# Patient Record
Sex: Female | Born: 1935 | Race: Black or African American | Hispanic: No | Smoking: Never smoker
Health system: Southern US, Community
[De-identification: ages and names within clinical notes are randomized; demographics above are authoritative.]

## PROBLEM LIST (undated history)

## (undated) DIAGNOSIS — H409 Unspecified glaucoma: Secondary | ICD-10-CM

## (undated) DIAGNOSIS — G309 Alzheimer's disease, unspecified: Secondary | ICD-10-CM

## (undated) DIAGNOSIS — N289 Disorder of kidney and ureter, unspecified: Secondary | ICD-10-CM

## (undated) DIAGNOSIS — I10 Essential (primary) hypertension: Secondary | ICD-10-CM

## (undated) DIAGNOSIS — H547 Unspecified visual loss: Secondary | ICD-10-CM

## (undated) DIAGNOSIS — F0280 Dementia in other diseases classified elsewhere without behavioral disturbance: Secondary | ICD-10-CM

## (undated) HISTORY — PX: ABDOMINAL HYSTERECTOMY: SHX81

## (undated) HISTORY — PX: EYE SURGERY: SHX253

---

## 2017-05-10 ENCOUNTER — Emergency Department
Admission: EM | Admit: 2017-05-10 | Discharge: 2017-05-10 | Disposition: A | Discharge status: Other Acute Inpatient Hospital | Payer: Medicare Other | Attending: Emergency Medicine | Admitting: Emergency Medicine

## 2017-05-10 ENCOUNTER — Encounter: Payer: Self-pay | Admitting: *Deleted

## 2017-05-10 ENCOUNTER — Emergency Department: Payer: Medicare Other

## 2017-05-10 ENCOUNTER — Ambulatory Visit
Admission: EM | Admit: 2017-05-10 | Discharge: 2017-05-10 | Disposition: A | Discharge status: Other Acute Inpatient Hospital | Payer: Medicaid Other | Attending: Family Medicine | Admitting: Family Medicine

## 2017-05-10 ENCOUNTER — Other Ambulatory Visit: Payer: Self-pay

## 2017-05-10 DIAGNOSIS — S0592XA Unspecified injury of left eye and orbit, initial encounter: Secondary | ICD-10-CM | POA: Diagnosis not present

## 2017-05-10 DIAGNOSIS — S0522XA Ocular laceration and rupture with prolapse or loss of intraocular tissue, left eye, initial encounter: Secondary | ICD-10-CM | POA: Insufficient documentation

## 2017-05-10 DIAGNOSIS — Z7982 Long term (current) use of aspirin: Secondary | ICD-10-CM | POA: Diagnosis not present

## 2017-05-10 DIAGNOSIS — S0590XA Unspecified injury of unspecified eye and orbit, initial encounter: Principal | ICD-10-CM

## 2017-05-10 DIAGNOSIS — W228XXA Striking against or struck by other objects, initial encounter: Secondary | ICD-10-CM

## 2017-05-10 DIAGNOSIS — W19XXXA Unspecified fall, initial encounter: Secondary | ICD-10-CM | POA: Diagnosis not present

## 2017-05-10 DIAGNOSIS — W01190A Fall on same level from slipping, tripping and stumbling with subsequent striking against furniture, initial encounter: Secondary | ICD-10-CM | POA: Diagnosis not present

## 2017-05-10 DIAGNOSIS — I10 Essential (primary) hypertension: Secondary | ICD-10-CM | POA: Diagnosis not present

## 2017-05-10 DIAGNOSIS — S0532XA Ocular laceration without prolapse or loss of intraocular tissue, left eye, initial encounter: Secondary | ICD-10-CM

## 2017-05-10 DIAGNOSIS — S0990XA Unspecified injury of head, initial encounter: Secondary | ICD-10-CM | POA: Diagnosis not present

## 2017-05-10 DIAGNOSIS — Y929 Unspecified place or not applicable: Secondary | ICD-10-CM | POA: Diagnosis not present

## 2017-05-10 DIAGNOSIS — Y998 Other external cause status: Secondary | ICD-10-CM | POA: Insufficient documentation

## 2017-05-10 DIAGNOSIS — Y9389 Activity, other specified: Secondary | ICD-10-CM | POA: Diagnosis not present

## 2017-05-10 DIAGNOSIS — Z79899 Other long term (current) drug therapy: Secondary | ICD-10-CM | POA: Insufficient documentation

## 2017-05-10 HISTORY — DX: Essential (primary) hypertension: I10

## 2017-05-10 HISTORY — DX: Disorder of kidney and ureter, unspecified: N28.9

## 2017-05-10 HISTORY — DX: Unspecified visual loss: H54.7

## 2017-05-10 LAB — CBC WITH DIFFERENTIAL/PLATELET
BASOS ABS: 0.1 10*3/uL (ref 0–0.1)
BASOS PCT: 1 %
EOS ABS: 0.1 10*3/uL (ref 0–0.7)
EOS PCT: 1 %
HCT: 40.9 % (ref 35.0–47.0)
Hemoglobin: 13.5 g/dL (ref 12.0–16.0)
Lymphocytes Relative: 15 %
Lymphs Abs: 1.7 10*3/uL (ref 1.0–3.6)
MCH: 31 pg (ref 26.0–34.0)
MCHC: 33.1 g/dL (ref 32.0–36.0)
MCV: 93.5 fL (ref 80.0–100.0)
Monocytes Absolute: 0.4 10*3/uL (ref 0.2–0.9)
Monocytes Relative: 4 %
NEUTROS PCT: 79 %
Neutro Abs: 9.1 10*3/uL — ABNORMAL HIGH (ref 1.4–6.5)
PLATELETS: 198 10*3/uL (ref 150–440)
RBC: 4.37 MIL/uL (ref 3.80–5.20)
RDW: 13.1 % (ref 11.5–14.5)
WBC: 11.3 10*3/uL — AB (ref 3.6–11.0)

## 2017-05-10 LAB — BASIC METABOLIC PANEL
ANION GAP: 8 (ref 5–15)
BUN: 13 mg/dL (ref 6–20)
CO2: 28 mmol/L (ref 22–32)
Calcium: 9.6 mg/dL (ref 8.9–10.3)
Chloride: 100 mmol/L — ABNORMAL LOW (ref 101–111)
Creatinine, Ser: 0.77 mg/dL (ref 0.44–1.00)
Glucose, Bld: 130 mg/dL — ABNORMAL HIGH (ref 65–99)
POTASSIUM: 4.1 mmol/L (ref 3.5–5.1)
SODIUM: 136 mmol/L (ref 135–145)

## 2017-05-10 MED ORDER — MORPHINE SULFATE (PF) 2 MG/ML IV SOLN
2.0000 mg | Freq: Once | INTRAVENOUS | Status: AC
  Administered 2017-05-10: 2 mg via INTRAMUSCULAR

## 2017-05-10 MED ORDER — LIDOCAINE HCL (PF) 1 % IJ SOLN
.50 | INTRAMUSCULAR | Status: DC
Start: ? — End: 2017-05-10

## 2017-05-10 MED ORDER — MORPHINE SULFATE (PF) 2 MG/ML IV SOLN
INTRAVENOUS | Status: AC
  Filled 2017-05-10: qty 1

## 2017-05-10 NOTE — ED Provider Notes (Signed)
[facility] Emergency Department Provider Note   ____________________________________________   I have reviewed the triage vital signs and the nursing notes.   HISTORY  Chief Complaint Facial Injury   History limited by: Not Limited   HPI Meagan Shah is a 84 y.o. female who presents to the emergency department today because of concern for left eye trauma. Patient went to sit back in a chair and missed it. States she hit her head against a piece of furniture. Went to urgent care which sent her to the emergency department because of concern for globe rupture. Patient however is legally blind and states that she has not been able to see for a long time.    Per medical record review patient has a history of blindness, HTN.   Past Medical History:  Diagnosis Date  . Blind   . Hypertension   . Renal disorder     There are no active problems to display for this patient.   Past Surgical History:  Procedure Laterality Date  . ABDOMINAL HYSTERECTOMY    . EYE SURGERY      Prior to Admission medications   Medication Sig Start Date End Date Taking? Authorizing Provider  amLODipine (NORVASC) 2.5 MG tablet Take 2.5 mg by mouth daily.   Yes [provider]  aspirin EC 81 MG tablet Take 81 mg by mouth daily.   Yes [provider]  Calcium Carb-Cholecalciferol (CALCIUM 1000 + D PO) Take 1 tablet by mouth daily.    Yes [provider]  losartan (COZAAR) 100 MG tablet Take 100 mg by mouth daily.    Yes [provider]  timolol (BETIMOL) 0.5 % ophthalmic solution Place 1 drop into both eyes daily.    Yes [provider]    Allergies Sulfa antibiotics  No family history on file.  Social History Social History   Tobacco Use  . Smoking status: Never Smoker  . Smokeless tobacco: Former NeurosurgeonUser    Types: Snuff  Substance Use Topics  . Alcohol use: No    Frequency: Never  . Drug use: No    Review of  Systems Constitutional: No fever/chills Eyes: Positive for left eye pain. ENT: No sore throat. Cardiovascular: Denies chest pain. Respiratory: Denies shortness of breath. Gastrointestinal: No abdominal pain.  No nausea, no vomiting.  No diarrhea.   Genitourinary: Negative for dysuria. Musculoskeletal: Negative for back pain. Skin: Negative for rash. Neurological: Positive for headache.  ____________________________________________   PHYSICAL EXAM:  VITAL SIGNS: ED Triage Vitals [05/10/17 1112]  Enc Vitals Group     BP (!) 189/87     Pulse Rate 80     Resp 18     Temp 98.9 F (37.2 C)     Temp Source Oral     SpO2 100 %     Weight 130 lb (59 kg)     Height 5\' 4"  (1.626 m)     Head Circumference      Peak Flow      Pain Score 10   Constitutional: Alert and oriented.  Eyes: Left eye with obvious trauma to the cornea.  Question ocular contents being extruded. ENT   Head: Normocephalic and atraumatic.   Nose: No congestion/rhinnorhea.   Mouth/Throat: Mucous membranes are moist.   Neck: No stridor. No midline tenderness.  Hematological/Lymphatic/Immunilogical: No cervical lymphadenopathy. Cardiovascular: Normal rate, regular rhythm.  No murmurs, rubs, or gallops.  Respiratory: Normal respiratory effort without tachypnea nor retractions. Breath sounds are clear and  equal bilaterally. No wheezes/rales/rhonchi. Gastrointestinal: Soft and non tender. No rebound. No guarding.  Genitourinary: Deferred Musculoskeletal: Normal range of motion in all extremities. Bilateral lower extremity swelling. Neurologic:  Normal speech and language. No gross focal neurologic deficits are appreciated.  Skin:  Skin is warm, dry and intact. No rash noted. Psychiatric: Mood and affect are normal. Speech and behavior are normal. Patient exhibits appropriate insight and judgment.  ____________________________________________    LABS (pertinent  positives/negatives)    ____________________________________________   EKG  None  ____________________________________________    RADIOLOGY  CT head/orbits No intracranial process. Left globe trauma with intra-vitreous hemorrhage.  ____________________________________________   PROCEDURES  Procedures  ____________________________________________   INITIAL IMPRESSION / ASSESSMENT AND PLAN / ED COURSE  Pertinent labs & imaging results that were available during my care of the patient were reviewed by me and considered in my medical decision making (see chart for details).  Presented to the emergency department today because of concerns for left eye injury.  On exam I do have concerns for significant injury and potential globe rupture. Did limit exam given concern for globe rupture.   Did send patient to CT which did show intraventricular hemorrhage.  Dr. Druscilla Brownie with ophthalmology came and evaluated the patient.  Feels patient might require enucleation but certainly requires more advanced care than can be accomplished here.  Will transfer patient to [facility].  ____________________________________________   FINAL CLINICAL IMPRESSION(S) / ED DIAGNOSES  Final diagnoses:  Ruptured globe of left eye, initial encounter     Note: This dictation was prepared with Dragon dictation. Any transcriptional errors that result from this process are unintentional     Phineas Semen, MD 05/10/17 1406

## 2017-05-10 NOTE — ED Triage Notes (Signed)
PAtient fell and injured her left eye this AM. Patient is blind.

## 2017-05-10 NOTE — ED Provider Notes (Signed)
[facility]    CSN: [id number] Arrival date & time: 05/10/17  1011     History   Chief Complaint Chief Complaint  Patient presents with  . Eye Injury    HPI Meagan Shah is a 84 y.o. female.   84 yo female accompanied by daughter with a c/o left eye injury after falling this morning and hitting her face and eyeball against a dresser.    The history is provided by a relative.    Past Medical History:  Diagnosis Date  . Blind   . Hypertension   . Renal disorder     There are no active problems to display for this patient.   Past Surgical History:  Procedure Laterality Date  . ABDOMINAL HYSTERECTOMY    . EYE SURGERY      OB History    No data available       Home Medications    Prior to Admission medications   Medication Sig Start Date End Date Taking? Authorizing Provider  amLODipine (NORVASC) 2.5 MG tablet Take 2.5 mg by mouth daily.   Yes [provider]  aspirin EC 81 MG tablet Take 81 mg by mouth daily.   Yes [provider]  Calcium Carb-Cholecalciferol (CALCIUM 1000 + D PO) Take 1 tablet by mouth daily.    Yes [provider]  losartan (COZAAR) 100 MG tablet Take 100 mg by mouth daily.    Yes [provider]  timolol (BETIMOL) 0.5 % ophthalmic solution Place 1 drop into both eyes daily.    Yes [provider]    Family History History reviewed. No pertinent family history.  Social History Social History   Tobacco Use  . Smoking status: Never Smoker  . Smokeless tobacco: Former Neurosurgeon    Types: Snuff  Substance Use Topics  . Alcohol use: No    Frequency: Never  . Drug use: No     Allergies   Sulfa antibiotics   Review of Systems Review of Systems   Physical Exam Triage Vital Signs ED Triage Vitals  Enc Vitals Group     BP 05/10/17 1025 (!) 172/68     Pulse Rate 05/10/17 1025 78     Resp 05/10/17 1025 16     Temp 05/10/17 1025 99 F (37.2 C)     Temp Source 05/10/17  1025 Oral     SpO2 05/10/17 1025 100 %     Weight 05/10/17 1029 130 lb (59 kg)     Height 05/10/17 1029 5\' 4"  (1.626 m)     Head Circumference --      Peak Flow --      Pain Score 05/10/17 1028 5     Pain Loc --      Pain Edu? --      Excl. in GC? --    No data found.  Updated Vital Signs BP (!) 172/68 (BP Location: Left Arm)   Pulse 78   Temp 99 F (37.2 C) (Oral)   Resp 16   Ht 5\' 4"  (1.626 m)   Wt 130 lb (59 kg)   SpO2 100%   BMI 22.31 kg/m   Visual Acuity Right Eye Distance:   Left Eye Distance:   Bilateral Distance:    Right Eye Near:   Left Eye Near:    Bilateral Near:     Physical Exam  Constitutional: She appears well-developed and well-nourished. No distress.  HENT:  Tenderness to palpation over the periorbital bones on  the left  Eyes: Left eye exhibits abnormal extraocular motion. Left pupil is not round. Pupils are unequal.  Blood from eye globe  Skin: She is not diaphoretic.  Nursing note and vitals reviewed.    UC Treatments / Results  Labs (all labs ordered are listed, but only abnormal results are displayed) Labs Reviewed - No data to display  EKG  EKG Interpretation None       Radiology No results found.  Procedures Procedures (including critical care time)  Medications Ordered in UC Medications - No data to display   Initial Impression / Assessment and Plan / UC Course  I have reviewed the triage vital signs and the nursing notes.  Pertinent labs & imaging results that were available during my care of the patient were reviewed by me and considered in my medical decision making (see chart for details).       Final Clinical Impressions(s) / UC Diagnoses   Final diagnoses:  Traumatic injury of globe of left eye    ED Discharge Orders    None     1.  diagnosis reviewed with patient and daughter; recommend patient go to the Emergency Department for further evaluation and management; patient will be transported by  daughter in private vehicle; report [facility]   Controlled Substance Prescriptions [place] Controlled Substance Registry consulted? Not Applicable   Payton Mccallumonty, Francesco Provencal, MD 05/10/17 [phone]

## 2017-05-10 NOTE — ED Notes (Signed)
Report given to EMS

## 2017-05-10 NOTE — ED Triage Notes (Signed)
approx 0945 this am patient fell and hit head on corner of furniture. Denies LOC. Patient is legally blind. Advance report was called in [facility] Izell [facility]ont[facility] with concerns patient had globe trauma.

## 2017-05-10 NOTE — ED Notes (Signed)
Dr. Burman NievesPortfilo at bedside.

## 2017-05-10 NOTE — ED Notes (Signed)
emtala reviewed by this RN 

## 2017-05-10 NOTE — ED Notes (Signed)
Patient left for CT scan,

## 2017-05-10 NOTE — Consult Note (Signed)
Subjective: Pt fell back in chair rotating to the left and struck OS on the corner of a wooden chest. Sudden pain and bleeding.  Pt has been blind for " many years" due to glaucoma, per she and her family. She is a known pt. At the [facility].  Objective: Vital signs in last 24 hours: Temp:  [98.9 F (37.2 C)-99 F (37.2 C)] 98.9 F (37.2 C) (03/02 1112) Pulse Rate:  [78-80] 80 (03/02 1145) Resp:  [16-18] 18 (03/02 1145) BP: (172-211)/(68-100) 211/100 (03/02 1145) SpO2:  [100 %] 100 % (03/02 1145) Weight:  [59 kg (130 lb)] 59 kg (130 lb) (03/02 1112)    To  examination of the left eye,blood soaked gauze was removed. There is minimal edema of lids. On gentle digital opening of the eye and examination with a hand held slit lamp, the cornea appears grossly ruptured with expulsion of iris, vitreous and uvea.   No results for input(s): WBC, HGB, HCT, NA, K, CL, CO2, BUN, CREATININE, GLU in the last 72 hours.  Invalid input(s): PLATELETS  Studies/Results:   Assessment/Plan: I reviewed the CT scan. The eye is full of blood, the retina/choroid appear displaced, the front of the eye appears disrupted. The sclera is largely intact.  This repair requires extensive surgery, possible vitrectomy. I did discuss possible primary enucleation , or primary repair followed by possible secondary enucleation.with the patient and family. This will be taken up with the consulting surgeon.  I have recommended medical transfer to [facility]. [facility] are [facility] if desired.  LOS: 0 days   Galen ManilaWilliam Abdirahim Flavell 3/2/20191:41 PM

## 2017-05-11 MED ORDER — ONDANSETRON HCL 4 MG/2ML IJ SOLN
4.00 | INTRAMUSCULAR | Status: DC
Start: ? — End: 2017-05-11

## 2017-05-11 MED ORDER — FENTANYL CITRATE (PF) 2500 MCG/50ML IJ SOLN
12.50 | INTRAMUSCULAR | Status: DC
Start: ? — End: 2017-05-11

## 2017-05-11 MED ORDER — LABETALOL HCL 5 MG/ML IV SOLN
10.00 | INTRAVENOUS | Status: DC
Start: ? — End: 2017-05-11

## 2017-05-11 MED ORDER — LACTATED RINGERS IV SOLN
INTRAVENOUS | Status: DC
Start: ? — End: 2017-05-11

## 2017-05-17 MED ORDER — BRIMONIDINE TARTRATE 0.15 % OP SOLN
1.00 | OPHTHALMIC | Status: DC
Start: 2017-05-15 — End: 2017-05-17

## 2017-05-17 MED ORDER — HEPARIN SODIUM (PORCINE) 5000 UNIT/ML IJ SOLN
5000.00 | INTRAMUSCULAR | Status: DC
Start: 2017-05-15 — End: 2017-05-17

## 2017-05-17 MED ORDER — MELATONIN 3 MG PO TABS
3.00 | ORAL_TABLET | ORAL | Status: DC
Start: 2017-05-15 — End: 2017-05-17

## 2017-05-17 MED ORDER — HYDRALAZINE HCL 20 MG/ML IJ SOLN
10.00 | INTRAMUSCULAR | Status: DC
Start: ? — End: 2017-05-17

## 2017-05-17 MED ORDER — GENERIC EXTERNAL MEDICATION
Status: DC
Start: ? — End: 2017-05-17

## 2017-05-17 MED ORDER — ASPIRIN EC 81 MG PO TBEC
81.00 | DELAYED_RELEASE_TABLET | ORAL | Status: DC
Start: 2017-05-16 — End: 2017-05-17

## 2017-05-17 MED ORDER — POLYETHYLENE GLYCOL 3350 17 G PO PACK
17.00 | PACK | ORAL | Status: DC
Start: 2017-05-16 — End: 2017-05-17

## 2017-05-17 MED ORDER — ACETAMINOPHEN 325 MG PO TABS
650.00 | ORAL_TABLET | ORAL | Status: DC
Start: ? — End: 2017-05-17

## 2017-05-17 MED ORDER — MOXIFLOXACIN HCL 0.5 % OP SOLN
1.00 | OPHTHALMIC | Status: DC
Start: 2017-05-15 — End: 2017-05-17

## 2017-05-17 MED ORDER — BISACODYL 10 MG RE SUPP
10.00 | RECTAL | Status: DC
Start: ? — End: 2017-05-17

## 2017-05-17 MED ORDER — ATROPINE SULFATE 1 % OP SOLN
1.00 | OPHTHALMIC | Status: DC
Start: 2017-05-15 — End: 2017-05-17

## 2017-05-17 MED ORDER — LOSARTAN POTASSIUM 50 MG PO TABS
100.00 | ORAL_TABLET | ORAL | Status: DC
Start: 2017-05-16 — End: 2017-05-17

## 2017-05-17 MED ORDER — AMLODIPINE BESYLATE 5 MG PO TABS
5.00 | ORAL_TABLET | ORAL | Status: DC
Start: 2017-05-16 — End: 2017-05-17

## 2017-05-17 MED ORDER — ERYTHROMYCIN 5 MG/GM OP OINT
1.00 | TOPICAL_OINTMENT | OPHTHALMIC | Status: DC
Start: 2017-05-15 — End: 2017-05-17

## 2017-05-17 MED ORDER — PREDNISOLONE ACETATE 1 % OP SUSP
1.00 | OPHTHALMIC | Status: DC
Start: 2017-05-15 — End: 2017-05-17

## 2017-10-08 ENCOUNTER — Inpatient Hospital Stay
Admission: EM | Admit: 2017-10-08 | Discharge: 2017-10-09 | DRG: 208 | Disposition: A | Discharge status: Hospice | Payer: Medicare Other | Source: Skilled Nursing Facility | Attending: Internal Medicine | Admitting: Internal Medicine

## 2017-10-08 ENCOUNTER — Emergency Department: Payer: Medicare Other

## 2017-10-08 ENCOUNTER — Other Ambulatory Visit: Payer: Self-pay

## 2017-10-08 DIAGNOSIS — R739 Hyperglycemia, unspecified: Secondary | ICD-10-CM | POA: Diagnosis present

## 2017-10-08 DIAGNOSIS — E870 Hyperosmolality and hypernatremia: Secondary | ICD-10-CM | POA: Diagnosis present

## 2017-10-08 DIAGNOSIS — Z7982 Long term (current) use of aspirin: Secondary | ICD-10-CM

## 2017-10-08 DIAGNOSIS — Z515 Encounter for palliative care: Secondary | ICD-10-CM | POA: Insufficient documentation

## 2017-10-08 DIAGNOSIS — Z880 Allergy status to penicillin: Secondary | ICD-10-CM | POA: Diagnosis not present

## 2017-10-08 DIAGNOSIS — J9600 Acute respiratory failure, unspecified whether with hypoxia or hypercapnia: Secondary | ICD-10-CM | POA: Diagnosis present

## 2017-10-08 DIAGNOSIS — H547 Unspecified visual loss: Secondary | ICD-10-CM | POA: Diagnosis present

## 2017-10-08 DIAGNOSIS — Z9289 Personal history of other medical treatment: Secondary | ICD-10-CM

## 2017-10-08 DIAGNOSIS — N182 Chronic kidney disease, stage 2 (mild): Secondary | ICD-10-CM | POA: Diagnosis present

## 2017-10-08 DIAGNOSIS — Z66 Do not resuscitate: Secondary | ICD-10-CM | POA: Diagnosis present

## 2017-10-08 DIAGNOSIS — Z79899 Other long term (current) drug therapy: Secondary | ICD-10-CM

## 2017-10-08 DIAGNOSIS — Z882 Allergy status to sulfonamides status: Secondary | ICD-10-CM

## 2017-10-08 DIAGNOSIS — Z83511 Family history of glaucoma: Secondary | ICD-10-CM

## 2017-10-08 DIAGNOSIS — F0280 Dementia in other diseases classified elsewhere without behavioral disturbance: Secondary | ICD-10-CM | POA: Diagnosis present

## 2017-10-08 DIAGNOSIS — J9601 Acute respiratory failure with hypoxia: Secondary | ICD-10-CM | POA: Diagnosis present

## 2017-10-08 DIAGNOSIS — R011 Cardiac murmur, unspecified: Secondary | ICD-10-CM | POA: Diagnosis present

## 2017-10-08 DIAGNOSIS — Z8249 Family history of ischemic heart disease and other diseases of the circulatory system: Secondary | ICD-10-CM

## 2017-10-08 DIAGNOSIS — Z90710 Acquired absence of both cervix and uterus: Secondary | ICD-10-CM | POA: Diagnosis not present

## 2017-10-08 DIAGNOSIS — N179 Acute kidney failure, unspecified: Secondary | ICD-10-CM | POA: Diagnosis present

## 2017-10-08 DIAGNOSIS — E872 Acidosis: Secondary | ICD-10-CM | POA: Diagnosis present

## 2017-10-08 DIAGNOSIS — I469 Cardiac arrest, cause unspecified: Secondary | ICD-10-CM | POA: Diagnosis present

## 2017-10-08 DIAGNOSIS — H409 Unspecified glaucoma: Secondary | ICD-10-CM | POA: Diagnosis present

## 2017-10-08 DIAGNOSIS — I129 Hypertensive chronic kidney disease with stage 1 through stage 4 chronic kidney disease, or unspecified chronic kidney disease: Secondary | ICD-10-CM | POA: Diagnosis present

## 2017-10-08 DIAGNOSIS — G309 Alzheimer's disease, unspecified: Secondary | ICD-10-CM | POA: Diagnosis present

## 2017-10-08 HISTORY — DX: Dementia in other diseases classified elsewhere, unspecified severity, without behavioral disturbance, psychotic disturbance, mood disturbance, and anxiety: F02.80

## 2017-10-08 HISTORY — DX: Alzheimer's disease, unspecified: G30.9

## 2017-10-08 HISTORY — DX: Unspecified glaucoma: H40.9

## 2017-10-08 LAB — CBC
HCT: 32.5 % — ABNORMAL LOW (ref 35.0–47.0)
HEMOGLOBIN: 10.1 g/dL — AB (ref 12.0–16.0)
MCH: 30.1 pg (ref 26.0–34.0)
MCHC: 31 g/dL — ABNORMAL LOW (ref 32.0–36.0)
MCV: 97.3 fL (ref 80.0–100.0)
Platelets: 72 10*3/uL — ABNORMAL LOW (ref 150–440)
RBC: 3.34 MIL/uL — AB (ref 3.80–5.20)
RDW: 13.3 % (ref 11.5–14.5)
WBC: 7.5 10*3/uL (ref 3.6–11.0)

## 2017-10-08 LAB — BLOOD GAS, ARTERIAL
Acid-base deficit: 14.8 mmol/L — ABNORMAL HIGH (ref 0.0–2.0)
Bicarbonate: 14 mmol/L — ABNORMAL LOW (ref 20.0–28.0)
FIO2: 1
O2 SAT: 99.9 %
PCO2 ART: 44 mmHg (ref 32.0–48.0)
PEEP: 5 cmH2O
Patient temperature: 37
RATE: 16 resp/min
VT: 400 mL
pH, Arterial: 7.11 — CL (ref 7.350–7.450)
pO2, Arterial: 414 mmHg — ABNORMAL HIGH (ref 83.0–108.0)

## 2017-10-08 LAB — COMPREHENSIVE METABOLIC PANEL
ALBUMIN: 2.4 g/dL — AB (ref 3.5–5.0)
ALK PHOS: 46 U/L (ref 38–126)
ALT: 435 U/L — ABNORMAL HIGH (ref 0–44)
AST: 521 U/L — ABNORMAL HIGH (ref 15–41)
Anion gap: 23 — ABNORMAL HIGH (ref 5–15)
BUN: 20 mg/dL (ref 8–23)
CALCIUM: 8.3 mg/dL — AB (ref 8.9–10.3)
CHLORIDE: 108 mmol/L (ref 98–111)
CO2: 15 mmol/L — AB (ref 22–32)
Creatinine, Ser: 1.41 mg/dL — ABNORMAL HIGH (ref 0.44–1.00)
GFR calc non Af Amer: 30 mL/min — ABNORMAL LOW (ref >60)
GFR, EST AFRICAN AMERICAN: 35 mL/min — AB (ref >60)
Glucose, Bld: 502 mg/dL (ref 70–99)
Potassium: 4.3 mmol/L (ref 3.5–5.1)
SODIUM: 146 mmol/L — AB (ref 135–145)
Total Bilirubin: 0.6 mg/dL (ref 0.3–1.2)
Total Protein: 5 g/dL — ABNORMAL LOW (ref 6.5–8.1)

## 2017-10-08 LAB — TROPONIN I: Troponin I: 0.05 ng/mL (ref <0.03)

## 2017-10-08 LAB — C DIFFICILE QUICK SCREEN W PCR REFLEX
C DIFFICILE (CDIFF) INTERP: NOT DETECTED
C DIFFICILE (CDIFF) TOXIN: NEGATIVE
C DIFFICLE (CDIFF) ANTIGEN: NEGATIVE

## 2017-10-08 LAB — LACTIC ACID, PLASMA: LACTIC ACID, VENOUS: 14.3 mmol/L — AB (ref 0.5–1.9)

## 2017-10-08 MED ORDER — SODIUM CHLORIDE 0.9 % IV SOLN: INTRAVENOUS | Status: DC

## 2017-10-08 MED ORDER — ASPIRIN EC 81 MG PO TBEC: 81.0000 mg | DELAYED_RELEASE_TABLET | Freq: Every day | ORAL | Status: DC

## 2017-10-08 MED ORDER — SODIUM CHLORIDE 0.9 % IV SOLN
Freq: Once | INTRAVENOUS | Status: AC
  Administered 2017-10-08: 08:00:00 via INTRAVENOUS

## 2017-10-08 MED ORDER — LORAZEPAM 2 MG/ML IJ SOLN
1.0000 mg | INTRAMUSCULAR | Status: DC | PRN
  Administered 2017-10-09 (×2): 1 mg via INTRAVENOUS
  Filled 2017-10-08 (×2): qty 1

## 2017-10-08 MED ORDER — ASPIRIN 300 MG RE SUPP: 300.0000 mg | RECTAL | Status: DC

## 2017-10-08 MED ORDER — SODIUM CHLORIDE 0.9 % IV SOLN: 250.0000 mL | INTRAVENOUS | Status: DC | PRN

## 2017-10-08 MED ORDER — ASPIRIN 81 MG PO CHEW: 324.0000 mg | CHEWABLE_TABLET | ORAL | Status: DC

## 2017-10-08 MED ORDER — EPINEPHRINE PF 1 MG/10ML IJ SOSY
PREFILLED_SYRINGE | INTRAMUSCULAR | Status: AC | PRN
  Administered 2017-10-08 (×3): 1 mg via INTRAVENOUS

## 2017-10-08 MED ORDER — ENOXAPARIN SODIUM 40 MG/0.4ML ~~LOC~~ SOLN: 40.0000 mg | SUBCUTANEOUS | Status: DC

## 2017-10-08 MED ORDER — BIOTENE DRY MOUTH MT LIQD
15.0000 mL | OROMUCOSAL | Status: DC | PRN
  Filled 2017-10-08: qty 15

## 2017-10-08 MED ORDER — MORPHINE SULFATE (PF) 2 MG/ML IV SOLN
1.0000 mg | INTRAVENOUS | Status: DC | PRN
  Administered 2017-10-08 – 2017-10-09 (×2): 1 mg via INTRAVENOUS
  Filled 2017-10-08 (×2): qty 1

## 2017-10-08 MED ORDER — NOREPINEPHRINE 4 MG/250ML-% IV SOLN
0.5000 ug/kg/min | Freq: Once | INTRAVENOUS | Status: AC
  Administered 2017-10-08: 0.5 ug/kg/min via INTRAVENOUS

## 2017-10-08 MED ORDER — GLYCOPYRROLATE 0.2 MG/ML IJ SOLN
0.2000 mg | INTRAMUSCULAR | Status: DC | PRN
  Administered 2017-10-08 – 2017-10-09 (×2): 0.2 mg via INTRAVENOUS
  Filled 2017-10-08 (×5): qty 1

## 2017-10-08 MED ORDER — EPINEPHRINE PF 1 MG/10ML IJ SOSY
PREFILLED_SYRINGE | INTRAMUSCULAR | Status: AC | PRN
  Administered 2017-10-08: 1 mg via INTRAVENOUS

## 2017-10-08 MED ORDER — FAMOTIDINE IN NACL 20-0.9 MG/50ML-% IV SOLN: 20.0000 mg | Freq: Two times a day (BID) | INTRAVENOUS | Status: DC

## 2017-10-08 MED ORDER — ONDANSETRON HCL 4 MG/2ML IJ SOLN: 4.0000 mg | Freq: Four times a day (QID) | INTRAMUSCULAR | Status: DC | PRN

## 2017-10-08 MED ORDER — ORAL CARE MOUTH RINSE
15.0000 mL | Freq: Two times a day (BID) | OROMUCOSAL | Status: DC
  Administered 2017-10-08 – 2017-10-09 (×3): 15 mL via OROMUCOSAL

## 2017-10-08 MED FILL — Medication: Qty: 1 | Status: AC

## 2017-10-08 NOTE — ED Provider Notes (Addendum)
[facility] Emergency Department Provider Note   ____________________________________________   First MD Initiated Contact with Patient 10/08/17 [phone]     (approximate)  I have reviewed the triage vital signs and the nursing notes.   HISTORY  Chief Complaint Respiratory Arrest   HPI Meagan Shah is a 84 y.o. female who comes from the nursing home.  EMS reports they were called because she was unresponsive.  She was breathing 12 times a minute when they got there but rapidly developed agonal respirations she was intubated with an ET tube and on the way here lost her pulse.  On arrival in the emergency room she was given epinephrine and bicarb and then 2 more doses of epinephrine while CPR was performed patient regained her pulse she was placed on levo fed at 0.5 mics per kilo per minute after few minutes pulse weekend Levophed was increased to 1 mic per kilo per minute but patient had already lost her pulse CPR was resumed she got another epinephrine pulse returned we are now paging the hospitalist  Past Medical History:  Diagnosis Date  . Blind   . Hypertension   . Renal disorder     There are no active problems to display for this patient.   Past Surgical History:  Procedure Laterality Date  . ABDOMINAL HYSTERECTOMY    . EYE SURGERY      Prior to Admission medications   Medication Sig Start Date End Date Taking? Authorizing Provider  aspirin EC 81 MG tablet Take 81 mg by mouth daily.   Yes [provider]  losartan (COZAAR) 100 MG tablet Take 100 mg by mouth daily.    Yes [provider]  Multiple Vitamins-Minerals (THERAVIM-M) TABS Take 1 tablet by mouth daily.   Yes [provider]  prednisoLONE acetate (PRED FORTE) 1 % ophthalmic suspension Place 1 drop into both eyes 3 (three) times daily. Wait 3 to 5 minutes between eye drops.   Yes [provider]  risperiDONE (RISPERDAL) 0.25 MG tablet Take 0.25 mg by  mouth daily.   Yes [provider]  timolol (BETIMOL) 0.5 % ophthalmic solution Place 1 drop into both eyes daily.    Yes [provider]    Allergies Sulfa antibiotics  No family history on file.  Social History Social History   Tobacco Use  . Smoking status: Never Smoker  . Smokeless tobacco: Former Neurosurgeon    Types: Snuff  Substance Use Topics  . Alcohol use: No    Frequency: Never  . Drug use: No    Review of Systems  Unable to obtain due to code ____________________________________________   PHYSICAL EXAM:  VITAL SIGNS: ED Triage Vitals  Enc Vitals Group     BP 10/08/17 0810 (!) 62/47     Pulse --      Resp 10/08/17 0810 (!) 21     Temp --      Temp src --      SpO2 --      Weight 10/08/17 0806 121 lb 4.1 oz (55 kg)     Height --      Head Circumference --      Peak Flow --      Pain Score --      Pain Loc --      Pain Edu? --      Excl. in GC? --     Constitutional: Unresponsive intubated Eyes: Conjunctivae are normal. Head: Atraumatic. Nose: No congestion/rhinnorhea. Mouth/Throat: Mucous  membranes are moist.  Oropharynx non-erythematous. Neck: No stridor.  Intubated  cardiovascular: 1 patient has a pulse normal rate, regular rhythm. Grossly normal heart sounds.  Good peripheral circulation. Respiratory: Intubated. Lungs CTAB. Gastrointestinal: Soft and nontender. No distention. No abdominal bruits. No CVA tenderness. Musculoskeletal: No lower extremity tenderness nor edema.  Neurologic: Unresponsive Skin:  Skin is warm, dry and intact. No rash noted.   ____________________________________________   LABS (all labs ordered are listed, but only abnormal results are displayed)  Labs Reviewed  BLOOD GAS, ARTERIAL - Abnormal; Notable for the following components:      Result Value   pH, Arterial 7.11 (*)    pO2, Arterial 414 (*)    Bicarbonate 14.0 (*)    Acid-base deficit 14.8 (*)    Allens test (pass/fail) NOT APPLICABLE (*)     All other components within normal limits  CBC - Abnormal; Notable for the following components:   RBC 3.34 (*)    Hemoglobin 10.1 (*)    HCT 32.5 (*)    MCHC 31.0 (*)    Platelets 72 (*)    All other components within normal limits  LACTIC ACID, PLASMA  COMPREHENSIVE METABOLIC PANEL  TROPONIN I  CBG MONITORING, ED   ____________________________________________  EKG  EKG read and interpreted by me does show sinus tachycardia rate of 101 normal axis flipped T's inferiorly and in the chest leads laterally no ST segment elevation ____________________________________________  RADIOLOGY  ED MD interpretation: Chest x-ray read by me shows ET tube in good position no infiltrates no pneumothorax  Official radiology report(s): Dg Chest Portable 1 View  Result Date: 10/08/2017 CLINICAL DATA:  84 year old female status post CPR and intubation. EXAM: PORTABLE CHEST 1 VIEW COMPARISON:  None. FINDINGS: Portable AP supine view at 0809 hours. Pacer/resuscitation pads project over the left chest. Endotracheal tube tip in good position just below the level the clavicles. Calcified aortic atherosclerosis. Other mediastinal contours are within normal limits. No pneumothorax or pleural effusion identified. Allowing for portable technique the lungs are clear. No displaced rib fracture identified. There is mild to moderate gaseous distension of the stomach in the left upper quadrant. IMPRESSION: 1. Endotracheal tube tip in good position. No acute cardiopulmonary abnormality identified. 2. Mild to moderate gaseous distension of the stomach. Consider enteric tube placement. 3.  Aortic Atherosclerosis (ICD10-I70.0). Electronically Signed   By: Odessa FlemingH  Hall M.D.   On: 10/08/2017 08:29   Dg Abd Portable 1 View  Result Date: 10/08/2017 CLINICAL DATA:  Status post nasogastric tube placement. EXAM: PORTABLE ABDOMEN - 1 VIEW COMPARISON:  None. FINDINGS: The bowel gas pattern is normal. Stool is noted in the rectum.  Distal tip of nasogastric tube is seen in expected position of the stomach. No radio-opaque calculi or other significant radiographic abnormality are seen. IMPRESSION: Distal tip of nasogastric tube seen in stomach. No evidence of bowel obstruction or ileus. Electronically Signed   By: Lupita RaiderJames  Green Jr, M.D.   On: 10/08/2017 08:51    ____________________________________________   PROCEDURES  Procedure(s) performed:   Procedures  Critical Care performed: Critical care time 45 minutes this includes running the code putting in an external jugular venous catheter which later blew, speaking to some of the family members and later extubating the patient after the hospitalist to talk to the patient's sister who had arrived just as he did and made her no code  ____________________________________________   INITIAL IMPRESSION / ASSESSMENT AND PLAN / ED COURSE  Patient on Levophed blood pressure  has gone up we are tapering the Levophed down we will cut down the oxygen to about 50% and repeat the blood gas hospitalist has been notified further labs are pending      ____________________________________________   FINAL CLINICAL IMPRESSION(S) / ED DIAGNOSES  Final diagnoses:  Successful cardiopulmonary resuscitation     ED Discharge Orders    None       Note:  This document was prepared using Dragon voice recognition software and may include unintentional dictation errors.    Arnaldo Natal, MD 10/08/17 1610    Arnaldo Natal, MD 10/08/17 1004

## 2017-10-08 NOTE — ED Triage Notes (Signed)
To ER via [facility] from [facility]. Found unresponsive by EMS. Agonal breathing at 12 per minute. Pt arrested en route, PEA at rate of 60BPM. Intubated by EMS, 22 at lip. CPR on arrival

## 2017-10-08 NOTE — ED Notes (Signed)
Top and bottom dentures in belonging bag at patient bedside. Shoes at patient bedside.

## 2017-10-08 NOTE — H&P (Signed)
[facility] [place] at [facility]   PATIENT NAME: Meagan Shah    MR#:  [id number]  DATE OF BIRTH:  [birth year]  DATE OF ADMISSION:  10/08/2017  PRIMARY CARE PHYSICIAN: Crummett, Lysle Rubens, NP   REQUESTING/REFERRING PHYSICIAN: Arnaldo Natal, MD  CHIEF COMPLAINT:   Chief Complaint  Patient presents with  . Respiratory Arrest    HISTORY OF PRESENT ILLNESS:  Meagan Shah  is a 84 y.o. female with a known history of Alzheimer's Disease, blind, HTN is seen for unresponsiveness. She is a resident at [facility] nursing home.  EMS reports they were called because she was unresponsive.  She was breathing 12 times a minute when they got there but rapidly developed agonal respirations she was intubated with an ET tube and on the way here lost her pulse.  On arrival in the emergency room she was given epinephrine and bicarb and then 2 more doses of epinephrine while CPR was performed patient regained her pulse she was placed on levo fed at 0.5 mics per kilo per minute after few minutes pulse weekend Levophed was increased to 1 mic per kilo per minute but patient had already lost her pulse CPR was resumed she got another epinephrine pulse returned.   Hospitalist was requested admission. I've seen and evaluated the patient. I talked to the daughter at bedside. She is not appropriate for aggressive care. Family agrees. Daughter is her HCPOA (her husband and caregiver are also at bedside) and in agreement to make her comfort care and place her under Hospice. If Hospice Home beds available and if she is eligible I would transport her there. Very poor prognosis. She will likely die. D/w daughter and EDP. I will place admission order if in case they want to move her out of ED and no Hospice Home beds. I've also discussed case with Palliative Care NP S. Shaffer who will see her. PAST MEDICAL HISTORY:   Past Medical History:  Diagnosis Date  . Alzheimer disease   . Blind   .  Glaucoma   . Hypertension   . Renal disorder    PAST SURGICAL HISTORY:   Past Surgical History:  Procedure Laterality Date  . ABDOMINAL HYSTERECTOMY    . EYE SURGERY     SOCIAL HISTORY:   Social History   Tobacco Use  . Smoking status: Never Smoker  . Smokeless tobacco: Former Neurosurgeon    Types: Snuff  Substance Use Topics  . Alcohol use: No    Frequency: Never    FAMILY HISTORY:  No family history on file. . Glaucoma Mother  . High blood pressure (Hypertension) Mother  DRUG ALLERGIES:   Allergies  Allergen Reactions  . Sulfa Antibiotics Rash  . Penicillins     REVIEW OF SYSTEMS:   ROS: unable to obtain due to unresponsive  MEDICATIONS AT HOME:   Prior to Admission medications   Medication Sig Start Date End Date Taking? Authorizing Provider  aspirin EC 81 MG tablet Take 81 mg by mouth daily.   Yes [provider]  losartan (COZAAR) 100 MG tablet Take 100 mg by mouth daily.    Yes [provider]  Multiple Vitamins-Minerals (THERAVIM-M) TABS Take 1 tablet by mouth daily.   Yes [provider]  prednisoLONE acetate (PRED FORTE) 1 % ophthalmic suspension Place 1 drop into both eyes 3 (three) times daily. Wait 3 to 5 minutes between eye drops.   Yes [provider]  risperiDONE (RISPERDAL) 0.25 MG tablet  Take 0.25 mg by mouth daily.   Yes [provider]  timolol (BETIMOL) 0.5 % ophthalmic solution Place 1 drop into both eyes daily.    Yes [provider]      VITAL SIGNS:  Blood pressure 129/81, pulse 98, temperature (!) 95.9 F (35.5 C), resp. rate (!) 34, weight 55 kg (121 lb 4.1 oz), SpO2 100 %. PHYSICAL EXAMINATION:  Physical Exam  GENERAL:  84 y.o.-year-old patient lying in the bed with no acute distress.  EYES: Pupils equal, round, reactive to light and accommodation. No scleral icterus. Extraocular muscles intact.  HEENT: Head atraumatic, normocephalic. Oropharynx and nasopharynx clear.  NECK:   Supple, no jugular venous distention. No thyroid enlargement, no tenderness.  LUNGS: Normal breath sounds bilaterally, no wheezing, rales,rhonchi or crepitation. No use of accessory muscles of respiration.  CARDIOVASCULAR: S1, S2 normal. No murmurs, rubs, or gallops.  ABDOMEN: Soft, nontender, nondistended. Bowel sounds present. No organomegaly or mass.  EXTREMITIES: No pedal edema, cyanosis, or clubbing.  NEUROLOGIC: Cranial nerves II through XII are intact. Difficult to evaluate as she is unresponsive and wouldn't cooperate  PSYCHIATRIC: The patient is unresponsive SKIN: No obvious rash, lesion, or ulcer.  LABORATORY PANEL:   CBC Recent Labs  Lab 10/08/17 0810  WBC 7.5  HGB 10.1*  HCT 32.5*  PLT 72*   ------------------------------------------------------------------------------------------------------------------  Chemistries  Recent Labs  Lab 10/08/17 0810  NA 146*  K 4.3  CL 108  CO2 15*  GLUCOSE 502*  BUN 20  CREATININE 1.41*  CALCIUM 8.3*  AST 521*  ALT 435*  ALKPHOS 46  BILITOT 0.6   ------------------------------------------------------------------------------------------------------------------  Cardiac Enzymes Recent Labs  Lab 10/08/17 0810  TROPONINI 0.05*   ------------------------------------------------------------------------------------------------------------------  RADIOLOGY:  Dg Chest Portable 1 View  Result Date: 10/08/2017 CLINICAL DATA:  84 year old female status post CPR and intubation. EXAM: PORTABLE CHEST 1 VIEW COMPARISON:  None. FINDINGS: Portable AP supine view at 0809 hours. Pacer/resuscitation pads project over the left chest. Endotracheal tube tip in good position just below the level the clavicles. Calcified aortic atherosclerosis. Other mediastinal contours are within normal limits. No pneumothorax or pleural effusion identified. Allowing for portable technique the lungs are clear. No displaced rib fracture identified. There is mild  to moderate gaseous distension of the stomach in the left upper quadrant. IMPRESSION: 1. Endotracheal tube tip in good position. No acute cardiopulmonary abnormality identified. 2. Mild to moderate gaseous distension of the stomach. Consider enteric tube placement. 3.  Aortic Atherosclerosis (ICD10-I70.0). Electronically Signed   By: Odessa FlemingH  Hall M.D.   On: 10/08/2017 08:29   Dg Abd Portable 1 View  Result Date: 10/08/2017 CLINICAL DATA:  Status post nasogastric tube placement. EXAM: PORTABLE ABDOMEN - 1 VIEW COMPARISON:  None. FINDINGS: The bowel gas pattern is normal. Stool is noted in the rectum. Distal tip of nasogastric tube is seen in expected position of the stomach. No radio-opaque calculi or other significant radiographic abnormality are seen. IMPRESSION: Distal tip of nasogastric tube seen in stomach. No evidence of bowel obstruction or ileus. Electronically Signed   By: Lupita RaiderJames  Green Jr, M.D.   On: 10/08/2017 08:51   IMPRESSION AND PLAN:  [age] f with unresponsives  * Unresponsiveness * Acute resp failure * Elevated LFTs * Hypernatremia  Family chose comfort care.   All the records are reviewed and case discussed with ED provider. Management plans discussed with the patient, family (daughter at bedside) and they are in agreement.  CODE STATUS: DNR  TOTAL TIME TAKING CARE  OF THIS PATIENT: 45 minutes.    Delfino Lovett M.D on 10/08/2017 at 9:58 AM  Between 7am to 6pm - Pager - [phone]  After 6pm go to [email or web address] - Social research officer, government  [facility] [place] [facility]  Office  [phone]  CC: Primary care physician; Crummett, Lysle Rubens, NP   Note: This dictation was prepared with Dragon dictation along with smaller phrase technology. Any transcriptional errors that result from this process are unintentional.

## 2017-10-08 NOTE — ED Notes (Signed)
Pt had large BM, changed by Vernona RiegerLaura, RN and Tammy, Emergency planning/management officermedic and Heather, Charity fundraiserN.  Taken to floor by Babette Relicammy, [employer] Homesmedic  Belongings including shoes and dentures taken with patient.

## 2017-10-08 NOTE — ED Notes (Signed)
Pulse check, PEA. CPR resumed

## 2017-10-08 NOTE — ED Notes (Signed)
Dr. Darnelle CatalanMalinda heard breath sounds bilaterally when ausultating.

## 2017-10-08 NOTE — ED Notes (Signed)
  EDP to family room to speak with patient caregiver who is here to see patient.

## 2017-10-08 NOTE — Code Documentation (Signed)
Pulse check-femoral pulses present at rate of 141 on monitor.

## 2017-10-08 NOTE — Code Documentation (Signed)
CPR started by ED staff at [id number].

## 2017-10-08 NOTE — Code Documentation (Signed)
Pulse check, PEA. CPR resumed

## 2017-10-08 NOTE — Progress Notes (Signed)
Family Meeting Note  Advance Directive:yes  Today a meeting took place with the Patient and Daughter at bedside.  Patient is unable to participate due ZO:XWRUEAto:Lacked capacity Comatose   The following clinical team members were present during this meeting:MD  The following were discussed:Patient's diagnosis: 84 y.o. female who comes from the [facility].  EMS reports they were called because she was unresponsive.  She was breathing 12 times a minute when they got there but rapidly developed agonal respirations she was intubated with an ET tube and on the way here lost her pulse.  On arrival in the emergency room she was given epinephrine and bicarb and then 2 more doses of epinephrine while CPR was performed patient regained her pulse she was placed on levo fed at 0.5 mics per kilo per minute after few minutes pulse weekend Levophed was increased to 1 mic per kilo per minute but patient had already lost her pulse CPR was resumed she got another epinephrine pulse returned.  Hospitalist was requested admission. I've seen and evaluated the patient. I talked to the daughter at bedside. She is not appropriate for aggressive care. Family agrees. Daughter is her HCPOA (her husband and caregiver are also at bedside) and in agreement to make her comfort care and place her under Hospice. If Hospice Home beds available and if she is eligible I would transport her there. Very poor prognosis. She will likely die. D/w daughter and EDP. I will place admission order if in case they want to move her out of ED and no Hospice Home beds. I've also discussed case with Palliative Care NP S. Shaffer who will see her.   Past Medical History:  Diagnosis Date  . Alzheimer disease   . Blind   . Glaucoma   . Hypertension   . Renal disorder     Patient's progosis: < 2 weeks and Goals for treatment: DNR  Additional follow-up to be provided: Consider Hospice Home if beds available. If not admit and possible  death.  Time spent during discussion:20 minutes  Delfino LovettVipul Kail Fraley, MD

## 2017-10-08 NOTE — Consult Note (Addendum)
Consultation Note Date: 10/08/2017   Patient Name: Meagan Shah  DOB: 1918/01/10  MRN: [id number]  Age / Sex: 84 y.o., female  PCP: Crummett, Perry Mount, NP Referring Physician: Max Sane, MD  Reason for Consultation: Hospice Evaluation, Non pain symptom management, Pain control, Psychosocial/spiritual support and Terminal Care  HPI/Patient Profile: 84 y.o. female  with past medical history of Alzheimer's, CKD, Glaucoma, blindness, and HTN admitted on 10/08/2017 unresponsive. Patient resided at [facility] and was found unresponsive. Lost pulse during transport to ED, received CPR and was intubated. ABG revealed pH of 7.1. Lactic acid 14.3. Family at bedside reported patient would not want aggressive measures and patient was extubated. PMT called for terminal care.   Clinical Assessment and Goals of Care: I have reviewed medical records including EPIC notes, labs and imaging, received report from Dr. Manuella Ghazi and ED RN, assessed the patient and then met at the bedside along with patient's daughter, son-in-law, and caregiver  to discuss diagnosis prognosis, GOC, EOL wishes, disposition and options.  I introduced Palliative Medicine as specialized medical care for people living with serious illness. It focuses on providing relief from the symptoms and stress of a serious illness. The goal is to improve quality of life for both the patient and the family.   We discussed their current illness and what it means in the larger context of their on-going co-morbidities.  Natural disease trajectory and expectations at EOL were discussed. Family understand patient is at end of life and want to focus on her comfort.  The difference between aggressive medical intervention and comfort care was considered in light of the patient's goals of care. Only comfort care is desired at this point.  Hospice and Palliative Care services outpatient were explained and offered. Family  would be interested in transfer to residential hospice; however, no beds available today so we discussed comfort care within the hospital and anticipating a hospital death.  Questions and concerns were addressed. The family was encouraged to call with questions or concerns.   Primary Decision Maker NEXT OF KIN - daughter, Altha Harm    SUMMARY OF RECOMMENDATIONS   - full comfort care - pt appears comfortable on my assessment - no hospice beds, admit for comfort care, anticipate hospital death - d/c all monitoring equipment Symptom Management:   Prn morphine, ativan, and robinul  Code Status/Advance Care Planning:  DNR  Palliative Prophylaxis:   Frequent Pain Assessment, Oral Care and Turn Reposition  Additional Recommendations (Limitations, Scope, Preferences):  Full Comfort Care  Psycho-social/Spiritual:   Desire for further Chaplaincy support:yes  Additional Recommendations: Education on Hospice  Prognosis:   Hours - Days  Discharge Planning: Anticipated Hospital Death      Primary Diagnoses: Present on Admission: . Acute respiratory failure ([place])   I have reviewed the medical record, interviewed the patient and family, and examined the patient. The following aspects are pertinent.  Past Medical History:  Diagnosis Date  . Alzheimer disease   . Blind   . Glaucoma   . Hypertension   . Renal disorder    Social History   Socioeconomic History  . Marital status: Widowed    Spouse name: Not on file  . Number of children: Not on file  . Years of education: Not on file  . Highest education level: Not on file  Occupational History  . Not on file  Social Needs  . Financial resource strain: Not on file  . Food insecurity:    Worry: Not on file  Inability: Not on file  . Transportation needs:    Medical: Not on file    Non-medical: Not on file  Tobacco Use  . Smoking status: Never Smoker  . Smokeless tobacco: Former Systems developer    Types: Snuff    Substance and Sexual Activity  . Alcohol use: No    Frequency: Never  . Drug use: No  . Sexual activity: Not on file  Lifestyle  . Physical activity:    Days per week: Not on file    Minutes per session: Not on file  . Stress: Not on file  Relationships  . Social connections:    Talks on phone: Not on file    Gets together: Not on file    Attends religious service: Not on file    Active member of club or organization: Not on file    Attends meetings of clubs or organizations: Not on file    Relationship status: Not on file  Other Topics Concern  . Not on file  Social History Narrative  . Not on file   No family history on file. Scheduled Meds: Continuous Infusions: PRN Meds:.antiseptic oral rinse, glycopyrrolate, LORazepam, morphine injection Allergies  Allergen Reactions  . Sulfa Antibiotics Rash  . Penicillins    Review of Systems  Unable to perform ROS: Patient unresponsive    Physical Exam  Constitutional: No distress.  HENT:  Head: Normocephalic and atraumatic.  Cardiovascular: Normal rate. An irregular rhythm present.  Pulmonary/Chest: Effort normal. No respiratory distress. She has rhonchi.  Neurological: She is unresponsive.  Skin: She is not diaphoretic.  Cool extremities    Vital Signs: BP 124/78   Pulse 98   Temp (!) 95.9 F (35.5 C)   Resp (!) 36   Wt 121 lb 4.1 oz (55 kg)   SpO2 96%   BMI 20.81 kg/m          SpO2: SpO2: 96 % O2 Device:SpO2: 96 % O2 Flow Rate: .O2 Flow Rate (L/min): 2 L/min  IO: Intake/output summary:   Intake/Output Summary (Last 24 hours) at 10/08/2017 1015 Last data filed at 10/08/2017 1010 Gross per 24 hour  Intake 952 ml  Output -  Net 952 ml    LBM:   Baseline Weight: Weight: 121 lb 4.1 oz (55 kg) Most recent weight: Weight: 121 lb 4.1 oz (55 kg)     Palliative Assessment/Data: PPS 10%     Time Total: 50 minutes Greater than 50%  of this time was spent counseling and coordinating care related to the  above assessment and plan.  Juel Burrow, DNP, AGNP-C Palliative Medicine Team [phone] Pager: [phone]

## 2017-10-08 NOTE — Code Documentation (Signed)
1 amp bicarb given by SwazilandJordan Loye, RN at R IO

## 2017-10-08 NOTE — Progress Notes (Signed)
Patient extubated to 2l [place] for comfort care per Dr. Farrel GobbleMalinda's request.

## 2017-10-08 NOTE — ED Notes (Signed)
Report to Berry CreekShae, [place]

## 2017-10-08 NOTE — ED Notes (Signed)
Per Dr. Dorothea GlassmanPaul Malinda, verbal order for patient systolic BP to stay "around 160". Titrate levophed to reflect.

## 2017-10-08 NOTE — ED Notes (Signed)
Date and time results received: 10/08/17 9:53 AM  (use smartphrase ".now" to insert current time)  Test: troponin 0.05, lactic 14.3, glucose 502  Name of Provider Notified: Dr. Dorothea GlassmanPaul Malinda

## 2017-10-08 NOTE — ED Notes (Addendum)
Pulse check, pulses found. ST on monitor. CPR stopped.

## 2017-10-08 NOTE — Code Documentation (Signed)
IO started to R knee

## 2017-10-08 NOTE — Progress Notes (Signed)
   10/08/17 1305  Clinical Encounter Type  Visited With Patient and family together  Visit Type Initial;Spiritual support;Patient actively dying  Referral From Nurse  Consult/Referral To Chaplain  Spiritual Encounters  Spiritual Needs Prayer;Emotional;Grief support   [facility] received an OR to visited with the family of Meagan Shah for a end of life consult. The family was very pleasant to speak with and appreciated the visit. I prayed for the patient and her family. I also brought a prayer shaw for the patient.

## 2017-10-08 NOTE — ED Notes (Addendum)
Per verbal order Dr. Sherryll BurgerShah that patient may go to hospice home from [facility] and is comfort care.  Pt extubated currently. Family at bedside.

## 2017-10-09 DIAGNOSIS — J9600 Acute respiratory failure, unspecified whether with hypoxia or hypercapnia: Secondary | ICD-10-CM

## 2017-10-09 DIAGNOSIS — Z515 Encounter for palliative care: Secondary | ICD-10-CM

## 2017-10-09 MED ORDER — MORPHINE SULFATE (PF) 2 MG/ML IV SOLN: 1.0000 mg | INTRAVENOUS | Status: DC | PRN

## 2017-10-09 MED ORDER — LORAZEPAM 2 MG/ML PO CONC: 1.0000 mg | ORAL | 0 refills | Status: AC | PRN

## 2017-10-09 MED ORDER — GLYCOPYRROLATE 1 MG/5ML PO SOLN: 2.0000 mg | ORAL | 0 refills | Status: DC | PRN

## 2017-10-09 MED ORDER — MORPHINE SULFATE (PF) 2 MG/ML IV SOLN
1.0000 mg | INTRAVENOUS | Status: DC | PRN
  Administered 2017-10-09: 2 mg via INTRAVENOUS

## 2017-10-09 MED ORDER — GLYCOPYRROLATE 1 MG/5ML PO SOLN: 0.4000 mg | ORAL | 0 refills | Status: AC | PRN

## 2017-10-09 MED ORDER — MORPHINE SULFATE (CONCENTRATE) 20 MG/ML PO SOLN: 10.0000 mg | ORAL | 0 refills | Status: AC | PRN

## 2017-10-09 NOTE — Discharge Summary (Signed)
[employer] - [place] at [facility]   PATIENT NAME: Meagan ShownCatherine Toothaker    MR#:  [id number]  DATE OF BIRTH:  03/07/1918  DATE OF ADMISSION:  10/08/2017   ADMITTING PHYSICIAN: Delfino LovettVipul Shah, MD  DATE OF DISCHARGE:  10/09/17  PRIMARY CARE PHYSICIAN: Crummett, Lysle Rubensaniel D, NP   ADMISSION DIAGNOSIS:   Successful cardiopulmonary resuscitation [Z92.89] Acute respiratory failure (HCC) [J96.00]  DISCHARGE DIAGNOSIS:   Active Problems:   Acute respiratory failure (HCC)   SECONDARY DIAGNOSIS:   Past Medical History:  Diagnosis Date  . Alzheimer disease   . Blind   . Glaucoma   . Hypertension   . Renal disorder     HOSPITAL COURSE:   84 year old female with past medical history significant for Alzheimer's dementia, glaucoma, hypertension and CKD brought in from assisted living facility secondary to unresponsiveness. She suffered cardiac arrest in route, was intubated and had CPR performed twice in the emergency room.  However after discussion with family, she is made comfort care. Severely acidotic on presentation. Also with renal failure and hyperglycemia. Current for comfort care.  Awaiting hospice bed. -Continue morphine and Ativan as needed.  Also on glycopyrrolate for increased secretions  Final diagnosis -Metabolic acidosis -Hyperglycemia -Acute renal failure -Alzheimers dementia - Glaucoma  Updated granddaughter at bedside    DISCHARGE CONDITIONS:   Critical  CONSULTS OBTAINED:   None  DRUG ALLERGIES:   Allergies  Allergen Reactions  . Sulfa Antibiotics Rash  . Penicillins    DISCHARGE MEDICATIONS:   Allergies as of 10/09/2017      Reactions   Sulfa Antibiotics Rash   Penicillins       Medication List    STOP taking these medications   aspirin EC 81 MG tablet   losartan 100 MG tablet Commonly known as:  COZAAR   prednisoLONE acetate 1 % ophthalmic suspension Commonly known as:  PRED FORTE   risperiDONE 0.25 MG  tablet Commonly known as:  RISPERDAL   THERAVIM-M Tabs   timolol 0.5 % ophthalmic solution Commonly known as:  BETIMOL     TAKE these medications   Glycopyrrolate 1 MG/5ML Soln Take 2 mLs (0.4 mg total) by mouth every 4 (four) hours as needed (for increased secretions).   LORazepam 2 MG/ML concentrated solution Commonly known as:  ATIVAN Take 0.5 mLs (1 mg total) by mouth every 4 (four) hours as needed for anxiety.   morphine 20 MG/ML concentrated solution Commonly known as:  ROXANOL Take 0.5 mLs (10 mg total) by mouth every 2 (two) hours as needed for moderate pain, severe pain or shortness of breath.        DISCHARGE INSTRUCTIONS:   1. Will be discharged to Hospice home  DIET:   As tolerated  OXYGEN:   Home Oxygen: Yes Oxygen Delivery: 2 liters/min via Patient connected to nasal cannula oxygen  DISCHARGE LOCATION:   Hospice home   If you experience worsening of your admission symptoms, develop shortness of breath, life threatening emergency, suicidal or homicidal thoughts you must seek medical attention immediately by calling 911 or calling your MD immediately  if symptoms less severe.  You Must read complete instructions/literature along with all the possible adverse reactions/side effects for all the Medicines you take and that have been prescribed to you. Take any new Medicines after you have completely understood and accpet all the possible adverse reactions/side effects.   Please note  You were cared for by a hospitalist during your hospital stay. If you have any  questions about your discharge medications or the care you received while you were in the hospital after you are discharged, you can call the unit and asked to speak with the hospitalist on call if the hospitalist that took care of you is not available. Once you are discharged, your primary care physician will handle any further medical issues. Please note that NO REFILLS for any discharge medications  will be authorized once you are discharged, as it is imperative that you return to your primary care physician (or establish a relationship with a primary care physician if you do not have one) for your aftercare needs so that they can reassess your need for medications and monitor your lab values.    On the day of Discharge:  VITAL SIGNS:   Blood pressure (!) 143/77, pulse 87, temperature 98.3 F (36.8 C), temperature source Oral, resp. rate (!) 22, weight 55 kg (121 lb 4.1 oz), SpO2 100 %.  PHYSICAL EXAMINATION:   GENERAL:  84 y.o.-year-old ill nourished patient lying in the bed, tachypneic with coarse breath sounds this morning EYES: Pupils equal, round, reactive to light and accommodation. No scleral icterus   HEENT: Head atraumatic, normocephalic. Oropharynx and nasopharynx clear.  Dry mucous membranes noted NECK:  Supple, no jugular venous distention. No thyroid enlargement, no tenderness.  LUNGS: Coarse breath sounds with gurgling, tachypneic.  Using accessory muscles to breathe. CARDIOVASCULAR: S1, S2 normal. No  rubs, or gallops.  2/6 systolic murmur present ABDOMEN: Soft, nontender, nondistended. Bowel sounds present. No organomegaly or mass.  EXTREMITIES: No pedal edema, cyanosis, or clubbing.  NEUROLOGIC: Patient is not responding.Marland Kitchen  PSYCHIATRIC: The patient is unresponsive SKIN: No obvious rash, lesion, or ulcer.    DATA REVIEW:   CBC Recent Labs  Lab 10/08/17 0810  WBC 7.5  HGB 10.1*  HCT 32.5*  PLT 72*    Chemistries  Recent Labs  Lab 10/08/17 0810  NA 146*  K 4.3  CL 108  CO2 15*  GLUCOSE 502*  BUN 20  CREATININE 1.41*  CALCIUM 8.3*  AST 521*  ALT 435*  ALKPHOS 46  BILITOT 0.6     Microbiology Results  Results for orders placed or performed during the hospital encounter of 10/08/17  C difficile quick scan w PCR reflex     Status: None   Collection Time: 10/08/17  5:48 PM  Result Value Ref Range Status   C Diff antigen NEGATIVE NEGATIVE  Final   C Diff toxin NEGATIVE NEGATIVE Final   C Diff interpretation No C. difficile detected.  Final    Comment: Performed at [facility], [address] [place], [place] [address]    RADIOLOGY:  No results found.   Management plans discussed with the patient, family and they are in agreement.  CODE STATUS:     Code Status Orders  (From admission, onward)        Start     Ordered   10/08/17 1136  Do not attempt resuscitation (DNR)  Continuous    Question Answer Comment  In the event of cardiac or respiratory ARREST Do not call a "code blue"   In the event of cardiac or respiratory ARREST Do not perform Intubation, CPR, defibrillation or ACLS   In the event of cardiac or respiratory ARREST Use medication by any route, position, wound care, and other measures to relive pain and suffering. May use oxygen, suction and manual treatment of airway obstruction as needed for comfort.      10/08/17  1135    Code Status History    Date Active Date Inactive Code Status Order ID Comments User Context   10/08/2017 1132 10/08/2017 1135 Full Code [id number]  Delfino Lovett, MD Inpatient   10/08/2017 1014 10/08/2017 1132 DNR [id number]  Joylene Draft, NP ED   10/08/2017 [phone] 10/08/2017 1014 DNR [id number]  Delfino Lovett, MD ED   10/08/2017 [phone] 10/08/2017 0929 DNR [id number]  Delfino Lovett, MD ED    Advance Directive Documentation     Most Recent Value  Type of Advance Directive  Healthcare Power of Attorney, Living will  Pre-existing out of facility DNR order (yellow form or pink MOST form)  -  "MOST" Form in Place?  -      TOTAL TIME TAKING CARE OF THIS PATIENT: 38 minutes.    Enid Baas M.D on 10/09/2017 at 2:45 PM  Between 7am to 6pm - Pager - [phone]  After 6pm go to [email or web address] - Social research officer, government  [facility] [place] [facility]  Office  [phone]  CC: Primary care physician; Crummett, Lysle Rubens, NP   Note: This dictation was prepared with Dragon  dictation along with smaller phrase technology. Any transcriptional errors that result from this process are unintentional.

## 2017-10-09 NOTE — Progress Notes (Signed)
[employer] - [place] at [facility]   PATIENT NAME: Meagan Shah    MR#:  [id number]  DATE OF BIRTH:  [birth year]  SUBJECTIVE:  CHIEF COMPLAINT:   Chief Complaint  Patient presents with  . Respiratory Arrest   -Patient from assisted living facility, was noted to be unresponsive and suffered cardiac arrest. -Currently comfort care.  Appears dyspneic and uncomfortable this morning.  Continue morphine and Ativan.  REVIEW OF SYSTEMS:  Review of Systems  Unable to perform ROS: Critical illness    DRUG ALLERGIES:   Allergies  Allergen Reactions  . Sulfa Antibiotics Rash  . Penicillins     VITALS:  Blood pressure (!) 143/77, pulse 87, temperature 98.3 F (36.8 C), temperature source Oral, resp. rate (!) 22, weight 55 kg (121 lb 4.1 oz), SpO2 100 %.  PHYSICAL EXAMINATION:  Physical Exam  GENERAL:  84 y.o.-year-old ill nourished patient lying in the bed, tachypneic with coarse breath sounds this morning EYES: Pupils equal, round, reactive to light and accommodation. No scleral icterus   HEENT: Head atraumatic, normocephalic. Oropharynx and nasopharynx clear.  Dry mucous membranes noted NECK:  Supple, no jugular venous distention. No thyroid enlargement, no tenderness.  LUNGS: Coarse breath sounds with gurgling, tachypneic.  Using accessory muscles to breathe. CARDIOVASCULAR: S1, S2 normal. No  rubs, or gallops.  2/6 systolic murmur present ABDOMEN: Soft, nontender, nondistended. Bowel sounds present. No organomegaly or mass.  EXTREMITIES: No pedal edema, cyanosis, or clubbing.  NEUROLOGIC: Patient is not responding.Marland Kitchen  PSYCHIATRIC: The patient is unresponsive SKIN: No obvious rash, lesion, or ulcer.    LABORATORY PANEL:   CBC Recent Labs  Lab 10/08/17 0810  WBC 7.5  HGB 10.1*  HCT 32.5*  PLT 72*   ------------------------------------------------------------------------------------------------------------------  Chemistries  Recent Labs  Lab  10/08/17 0810  NA 146*  K 4.3  CL 108  CO2 15*  GLUCOSE 502*  BUN 20  CREATININE 1.41*  CALCIUM 8.3*  AST 521*  ALT 435*  ALKPHOS 46  BILITOT 0.6   ------------------------------------------------------------------------------------------------------------------  Cardiac Enzymes Recent Labs  Lab 10/08/17 0810  TROPONINI 0.05*   ------------------------------------------------------------------------------------------------------------------  RADIOLOGY:  Dg Chest Portable 1 View  Result Date: 10/08/2017 CLINICAL DATA:  84 year old female status post CPR and intubation. EXAM: PORTABLE CHEST 1 VIEW COMPARISON:  None. FINDINGS: Portable AP supine view at 0809 hours. Pacer/resuscitation pads project over the left chest. Endotracheal tube tip in good position just below the level the clavicles. Calcified aortic atherosclerosis. Other mediastinal contours are within normal limits. No pneumothorax or pleural effusion identified. Allowing for portable technique the lungs are clear. No displaced rib fracture identified. There is mild to moderate gaseous distension of the stomach in the left upper quadrant. IMPRESSION: 1. Endotracheal tube tip in good position. No acute cardiopulmonary abnormality identified. 2. Mild to moderate gaseous distension of the stomach. Consider enteric tube placement. 3.  Aortic Atherosclerosis (ICD10-I70.0). Electronically Signed   By: Odessa Fleming M.D.   On: 10/08/2017 08:29   Dg Abd Portable 1 View  Result Date: 10/08/2017 CLINICAL DATA:  Status post nasogastric tube placement. EXAM: PORTABLE ABDOMEN - 1 VIEW COMPARISON:  None. FINDINGS: The bowel gas pattern is normal. Stool is noted in the rectum. Distal tip of nasogastric tube is seen in expected position of the stomach. No radio-opaque calculi or other significant radiographic abnormality are seen. IMPRESSION: Distal tip of nasogastric tube seen in stomach. No evidence of bowel obstruction or ileus. Electronically  Signed   By: Fayrene Fearing  Christen ButterGreen Jr, M.D.   On: 10/08/2017 08:51    EKG:   Orders placed or performed during the hospital encounter of 10/08/17  . ED EKG  . ED EKG  . EKG 12-Lead  . EKG 12-Lead    ASSESSMENT AND PLAN:   84 year old female with past medical history significant for Alzheimer's dementia, glaucoma, hypertension and CKD brought in from assisted living facility secondary to unresponsiveness. She suffered cardiac arrest in route, was intubated and had CPR performed twice in the emergency room.  However after discussion with family, she is made comfort care. Severely acidotic on presentation. Also with renal failure and hyperglycemia. Current for comfort care.  Awaiting hospice bed. -Continue morphine and Ativan as needed.  Also on glycopyrrolate for increased secretions  Final diagnosis -Metabolic acidosis -Hyperglycemia -Acute renal failure -Alzheimers dementia - Glaucoma  Updated granddaughter at bedside  All the records are reviewed and case discussed with Care Management/Social Workerr. Management plans discussed with the patient, family and they are in agreement.  CODE STATUS: DNR  TOTAL TIME TAKING CARE OF THIS PATIENT: 26 minutes.   POSSIBLE D/C IN 1-2 DAYS, DEPENDING ON CLINICAL CONDITION.   Enid BaasKALISETTI,Holland Kotter M.D on 10/09/2017 at 2:39 PM  Between 7am to 6pm - Pager - [phone]  After 6pm go to [email or web address] - Social research officer, governmentpassword [email or web address] [email or web address]  [facility] [place] [facility]  Office  [phone]  CC: Primary care physician; Crummett, Lysle Rubensaniel D, NP

## 2017-10-09 NOTE — Progress Notes (Signed)
   10/09/17 1305  Clinical Encounter Type  Visited With Patient and family together  Visit Type Follow-up;Spiritual support;Patient actively dying  Referral From Nurse  Consult/Referral To Chaplain  Spiritual Encounters  Spiritual Needs Prayer;Emotional;Grief support   [facility] followed up with Ms. Biela and her family. The patient was resting and appeared to be in no distress.The family was talking about Ms. Beehler and what she means to the family. I provided a pastoral presence and active listening.

## 2017-10-09 NOTE — Progress Notes (Signed)
Patient discharged to hospice via EMS at 2030, two IV's removed

## 2017-10-09 NOTE — Progress Notes (Signed)
Nutrition Brief Note  RD drawn to pt due to positive MST score.  Chart reviewed. Pt now transitioning to comfort care. No nutrition interventions warranted at this time.  Please consult as needed.    Earma ReadingKate Jablonski Adana Marik, MS, RD, LDN Pager: [phone] Weekend/After Hours: [phone]

## 2017-10-09 NOTE — Clinical Social Work Note (Signed)
Clinical Social Work Assessment  Patient Details  Name: Meagan Shah MRN: [id number] Date of Birth: 10-Aug-1917  Date of referral:  10/09/17               Reason for consult:  Facility Placement, End of Life/Hospice                Permission sought to share information with:  Case Manager, Customer service manager, Family Supports Permission granted to share information::  Yes, Verbal Permission Granted  Name::        Agency::     Relationship::     Contact Information:     Housing/Transportation Living arrangements for the past 2 months:  [place] of Information:  Adult Children Patient Interpreter Needed:  None Criminal Activity/Legal Involvement Pertinent to Current Situation/Hospitalization:  No - Comment as needed Significant Relationships:  Adult Children Lives with:  Facility Resident Do you feel safe going back to the place where you live?  No Need for family participation in patient care:  Yes (Comment)  Care giving concerns:  Patient lives at [facility]    Social Worker assessment / plan:  CSW received consult for hospice facility placement. CSW met with patient's daughter Meagan Shah at bedside. Daughter states that family would like patient to go to [facility]. CSW notified Santiago Glad, hospice liaison of referral.   Employment status:  Retired Forensic scientist:  Medicare PT Recommendations:  Not assessed at this time Information / Referral to community resources:  Other (Comment Required)(Hospice Home )  Patient/Family's Response to care:  Patient is unresponsive   Patient/Family's Understanding of and Emotional Response to Diagnosis, Current Treatment, and Prognosis:  CSW provided emotional support as daughter is processing end of life for her mother. Daughter is in good spirits and states that she also has strong family support.   Emotional Assessment Appearance:  Appears stated  age Attitude/Demeanor/Rapport:  Unresponsive Affect (typically observed):    Orientation:    Alcohol / Substance use:  Not Applicable Psych involvement (Current and /or in the community):  No (Comment)  Discharge Needs  Concerns to be addressed:  Discharge Planning Concerns Readmission within the last 30 days:  No Current discharge risk:  None Barriers to Discharge:  Hospice Bed not available   Meagan Shah, [place] 10/09/2017, 8:36 AM

## 2017-10-09 NOTE — Progress Notes (Signed)
Daily Progress Note   Patient Name: Meagan Shah       Date: 10/09/2017 DOB: 04/20/1917  Age: 84 y.o. MRN#: [id number] Attending Physician: Enid BaasKalisetti, Radhika, MD Primary Care Physician: Elby Beckrummett, Daniel D, NP Admit Date: 10/08/2017  Reason for Consultation/Follow-up: Establishing goals of care and Terminal Care  Subjective: Patient in bed- unresponsive. Breathing labored. Family at bedside. [facility]   ROS  Length of Stay: 1  Current Medications: Scheduled Meds:  . mouth rinse  15 mL Mouth Rinse BID    Continuous Infusions:   PRN Meds: antiseptic oral rinse, glycopyrrolate, LORazepam, morphine injection, ondansetron (ZOFRAN) IV  Physical Exam          Vital Signs: BP (!) 143/77 (BP Location: Right Arm)   Pulse 87   Temp 98.3 F (36.8 C) (Oral)   Resp (!) 22   Wt 55 kg (121 lb 4.1 oz)   SpO2 100%   BMI 20.81 kg/m  SpO2: SpO2: 100 % O2 Device: O2 Device: Nasal Cannula O2 Flow Rate: O2 Flow Rate (L/min): 2 L/min  Intake/output summary:   Intake/Output Summary (Last 24 hours) at 10/09/2017 1631 Last data filed at 10/09/2017 1501 Gross per 24 hour  Intake 0 ml  Output 600 ml  Net -600 ml   LBM: Last BM Date: 10/08/17 Baseline Weight: Weight: 55 kg (121 lb 4.1 oz) Most recent weight: Weight: 55 kg (121 lb 4.1 oz)       Palliative Assessment/Data:    Flowsheet Rows     Most Recent Value  Intake Tab  Referral Department  Hospitalist  Unit at Time of Referral  ER  Palliative Care Primary Diagnosis  Pulmonary  Date Notified  10/08/17  Palliative Care Type  New Palliative care  Reason for referral  End of Life Care Assistance  Date of Admission  10/08/17  Date first seen by Palliative Care  10/08/17  # of days Palliative referral response  time  0 Day(s)  # of days IP prior to Palliative referral  0  Clinical Assessment  Palliative Performance Scale Score  10%  Psychosocial & Spiritual Assessment  Palliative Care Outcomes  Patient/Family meeting held?  Yes  Who was at the meeting?  -- [daughter, SIL, caregiver]  Palliative Care Outcomes  Improved pain interventions, Improved non-pain symptom therapy, Clarified  goals of care, Counseled regarding hospice, Provided end of life care assistance, Provided psychosocial or spiritual support, Changed to focus on comfort, Transitioned to hospice      Patient Active Problem List   Diagnosis Date Noted  . Acute respiratory failure (HCC) 10/08/2017  . End of life care   . Comfort measures only status   . Palliative care by specialist     Palliative Care Assessment & Plan   Patient Profile: 84 y.o. female  with past medical history of Alzheimer's, CKD, Glaucoma, blindness, and HTN admitted on 10/08/2017 unresponsive. Patient resided at [facility] and was found unresponsive. Lost pulse during transport to ED, received CPR and was intubated. ABG revealed pH of 7.1. Lactic acid 14.3. Family at bedside reported patient would not want aggressive measures and patient was extubated. PMT called for terminal care.    Assessment/Recommendations/Plan   Continue current comfort care measures  Transfer to hospice house when bed available  Goals of Care and Additional Recommendations:  Limitations on Scope of Treatment: Full Comfort Care  Code Status:  DNR  Prognosis:   < 2 weeks  Discharge Planning:  Hospice facility  Care plan was discussed with patient's daughter.   Thank you for allowing the Palliative Medicine Team to assist in the care of this patient.   Time In: 1100 Time Out: 1125 Total Time 25 minutes Prolonged Time Billed no      Greater than 50%  of this time was spent counseling and coordinating care related to the above assessment and plan.  Ocie Bob,  AGNP-C Palliative Medicine   Please contact Palliative Medicine Team phone at [phone] for questions and concerns.

## 2017-10-09 NOTE — Progress Notes (Signed)
New Hospice home referral received from [place]. Patient is a 84 year old woman admitted to [facility] from [facility] on 7/31 after being found unresponsive.She lost pulse during transport to ED, received CPR and was intubated. ABG revealed pH of 7.1. Lactic acid 14.3. Family was at bedside in the ED and reported patient would not want aggressive measures and patient was extubated. Patient was admitted for comfort care and Palliative Medicine was consulted for terminal care. Family wishes for patient to be transferred to the hospice home for symptom management and end of life care.  Writer met in the room with patient's daughter Altha Harm to initiate education regarding hospice services, philosophy and team approach to care with understanding voiced. Questions answered. Consents signed. Bed became open later today. Plan is for discharge to the hospice home via EMS with signed DNR in place. Hospital care team and family updated. Report called to the hospice home, EMS notified for transport. Thank you for the opportunity to be involved in the care of this patient. Flo Shanks RN, BSN, [facility] [place], hospital liaison [phone]

## 2017-11-09 DEATH — deceased

## 2018-10-12 IMAGING — CT CT ORBITS W/O CM
3 of 8 series · 13 of 47 positions shown, 16 images · non-contrast
Comparison: None.

CLINICAL DATA: [AGE] female status post fall this morning
striking her face and eye against furniture. Left eye pain and
frontal headache.

EXAM:
CT HEAD AND ORBITS WITHOUT CONTRAST
TECHNIQUE: Contiguous axial images were obtained from the base of the skull
through the vertex without contrast. Multidetector CT imaging of the
orbits was performed using the standard protocol without intravenous
contrast.

[Series 2: head wo · axial · 0.44mm/px · z∈[-134,-9]mm · 10 of 31 slices shown, 13 images]
[im 3/31  brain]
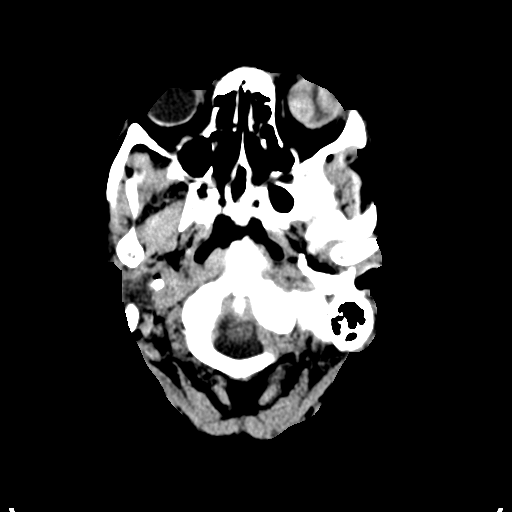
[im 3/31  bone]
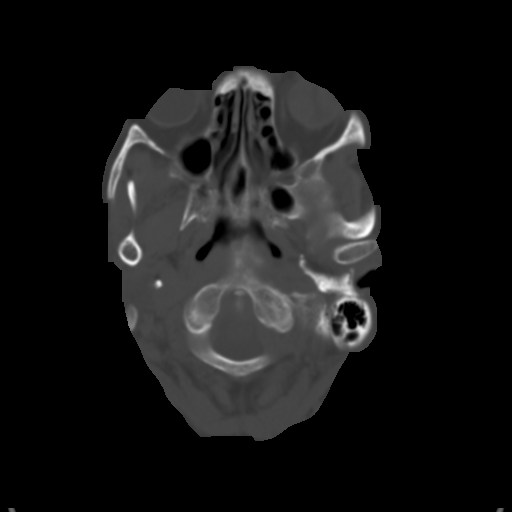
[im 6/31  bone]
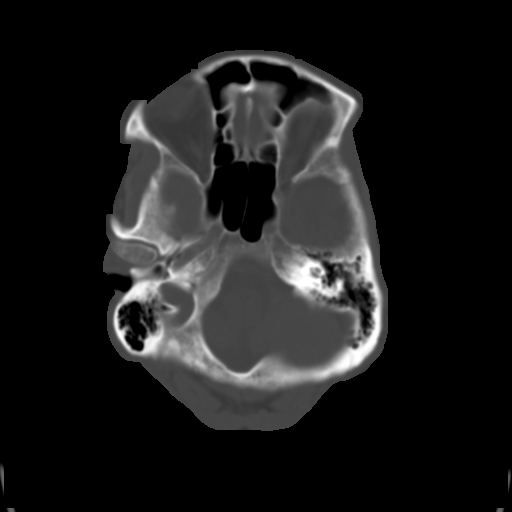
[im 9/31  bone]
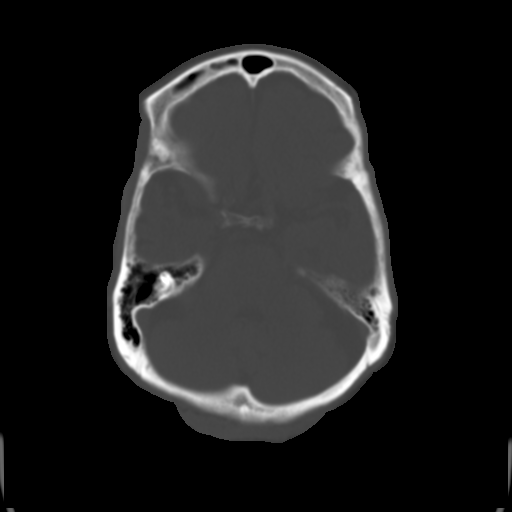
[im 11/31  bone]
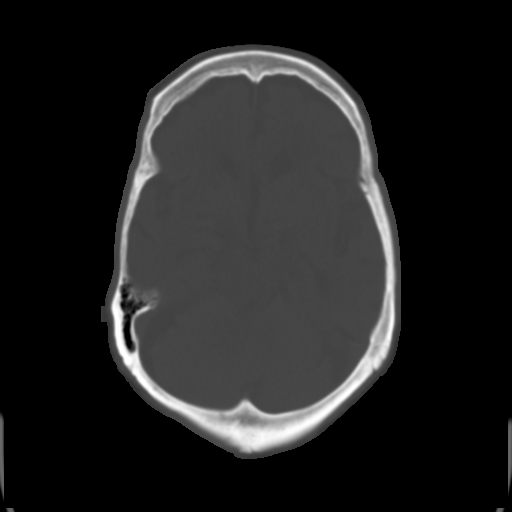
[im 14/31  brain]
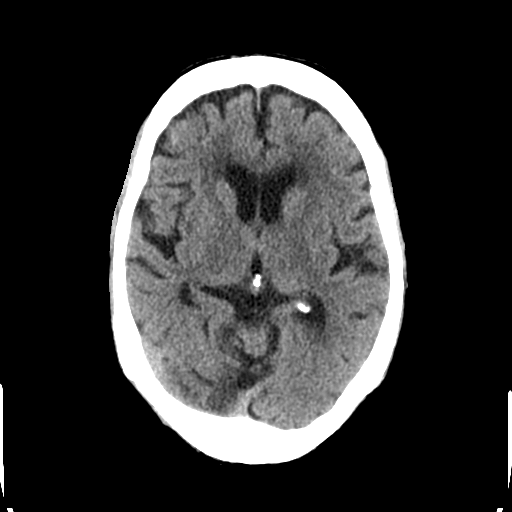
[im 14/31  bone]
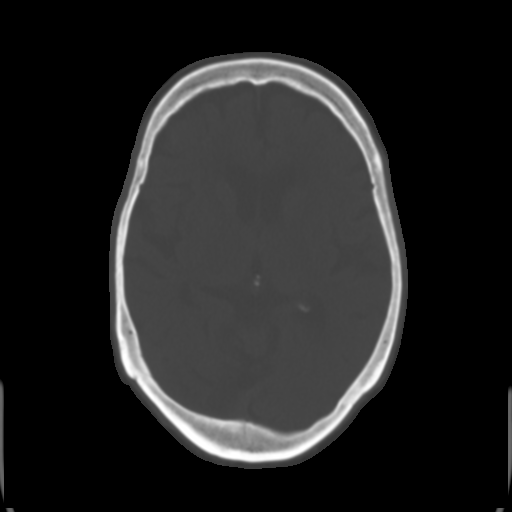
[im 17/31  bone]
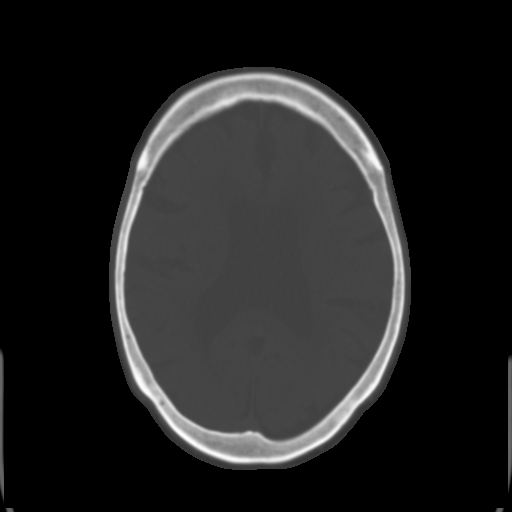
[im 20/31  bone]
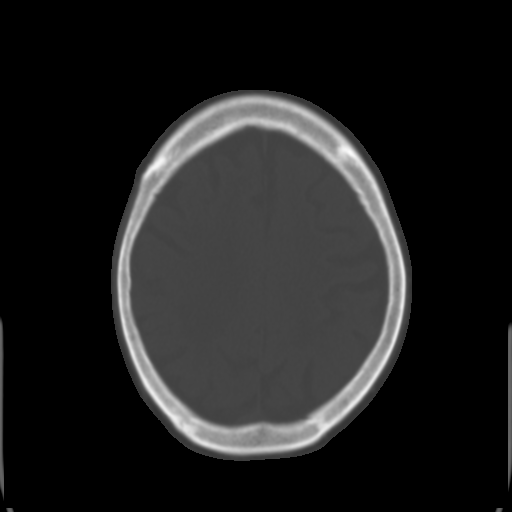
[im 22/31  bone]
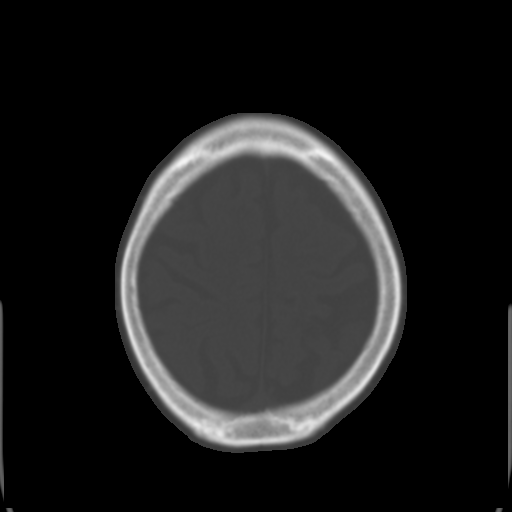
[im 25/31  brain]
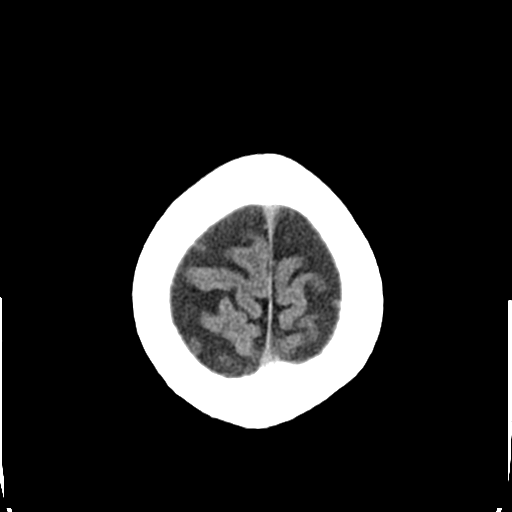
[im 25/31  bone]
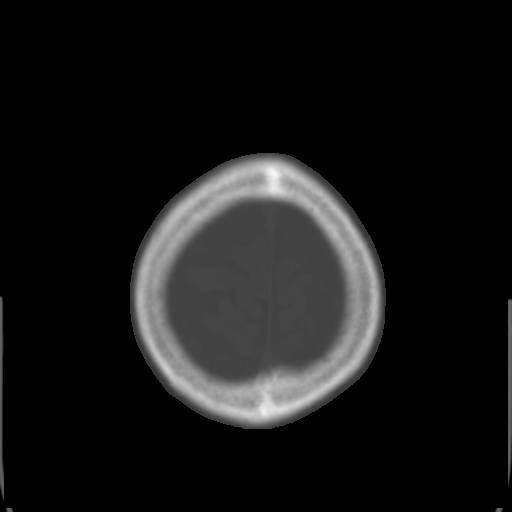
[im 28/31  bone]
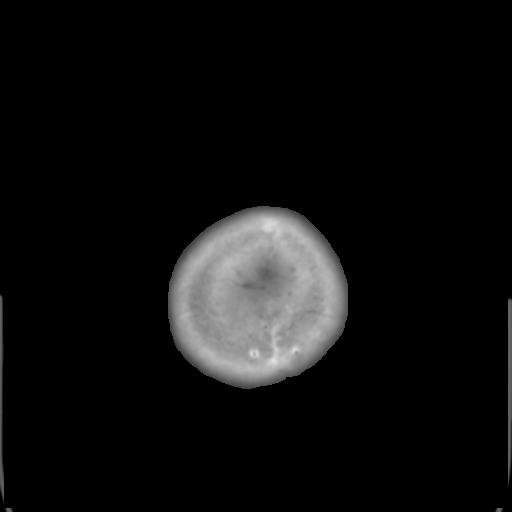

[Series 10: orbits 2.0 coronal · coronal · 0.22mm/px · 2 of 77 slices shown]
[im 26/77  bone]
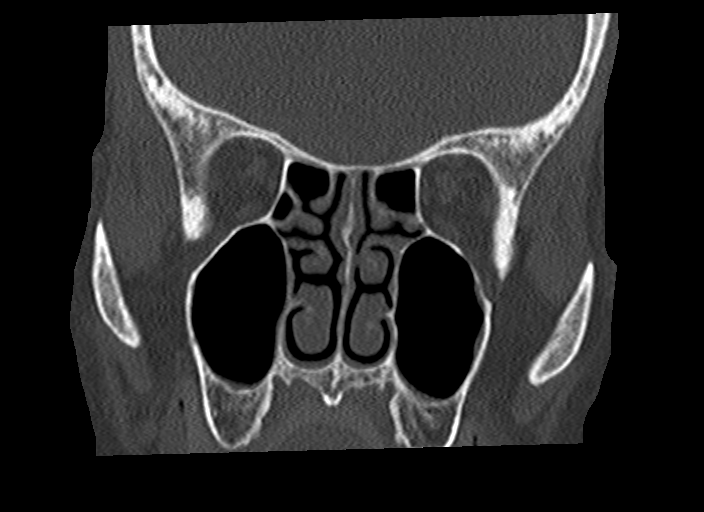
[im 51/77  bone]
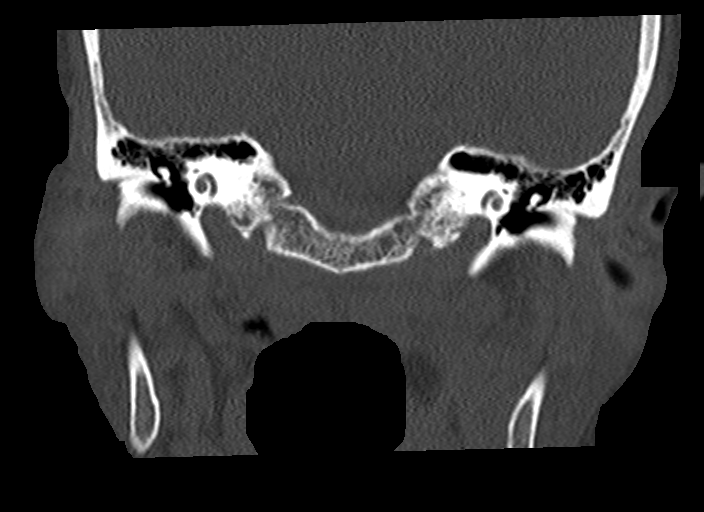

[Series 11: orbits 2.0 sagittal · sagittal · 0.21mm/px · 1 of 78 slices shown]
[im 39/78  bone]
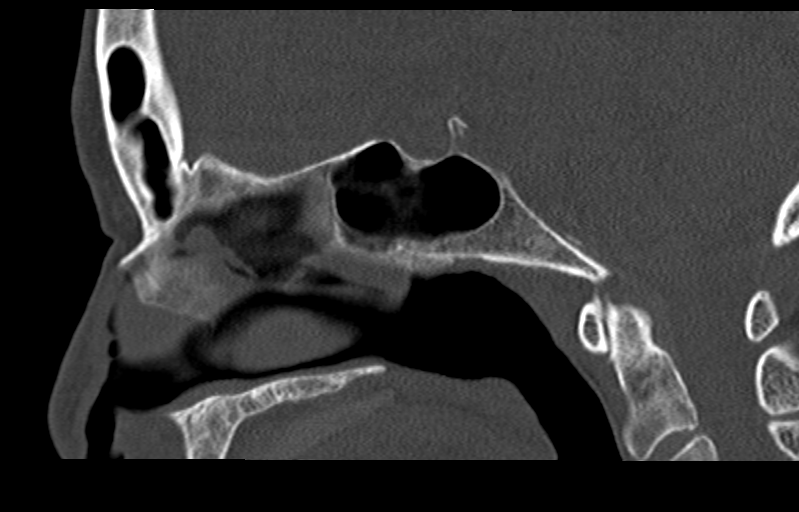

[13 of 47 positions shown; findings below may reference images not displayed]

FINDINGS: CT HEAD FINDINGS

Brain: Patchy bilateral cerebral white matter hypodensity. There is
a small area of cortical encephalomalacia in the left parietal lobe
on series 2, image 22. No midline shift, ventriculomegaly, mass
effect, evidence of mass lesion, intracranial hemorrhage or evidence
of cortically based acute infarction.

Vascular: Calcified atherosclerosis at the skull base. No suspicious
intracranial vascular hyperdensity.

Skull: Questionable nondisplaced left frontal bone fracture (series
3, image 21), but I favor instead this is a nutrient foramen. There
is no prior CT for comparison. Otherwise the left frontal bone
appears intact. The calvarium elsewhere is intact.

Other: No acute scalp soft tissue findings, no scalp hematoma
identified.

CT ORBITS FINDINGS

Orbits: Postoperative changes to the right globe, but otherwise the
right orbits soft tissues appear normal.

There is hemorrhage throughout the right globe. On coronal images
the appearance resembles multifocal choroidal hemorrhage and
detachment (series 12, image 15). The shape of the globe is
maintained such that the left sclera appears to remain intact. There
are probably superimposed chronic postoperative changes to the
anterior chamber.

There is superimposed preseptal soft tissue swelling/hematoma. No
postseptal hematoma or contusion is identified.

The left orbital walls are intact. No acute facial bone fracture
identified.

Visualized sinuses: Mild symmetric ethmoid and maxillary sinus
mucosal thickening. No sinus hemorrhage or fluid level.

Soft tissues: Negative visible pharynx, parapharyngeal spaces,
retropharyngeal space, masticator and parotid spaces. Calcified
cervical carotid atherosclerosis.
IMPRESSION: 1. Trauma to the left globe with abundant intra-vitreous hemorrhage,
but the sclera appears to remain intact. Superimposed preseptal
hematoma. No retro bulb are hematoma. No associated orbital wall
fracture.
2. A questionable nondisplaced left frontal bone fracture is
probably a nutrient foramen instead. This is superior and lateral to
the left frontal sinus which remains normal.
3. No other skull or visible facial bone fracture identified. Normal
right orbit.
4. No acute intracranial abnormality. Chronic mild to moderate for
age chronic ischemic disease.

## 2019-03-12 IMAGING — DX DG CHEST 1V PORT
1 series · 1 of 1 positions shown · non-contrast
Comparison: None.

CLINICAL DATA: [AGE] female status post CPR and intubation.

EXAM:
PORTABLE CHEST 1 VIEW

[chest ap]
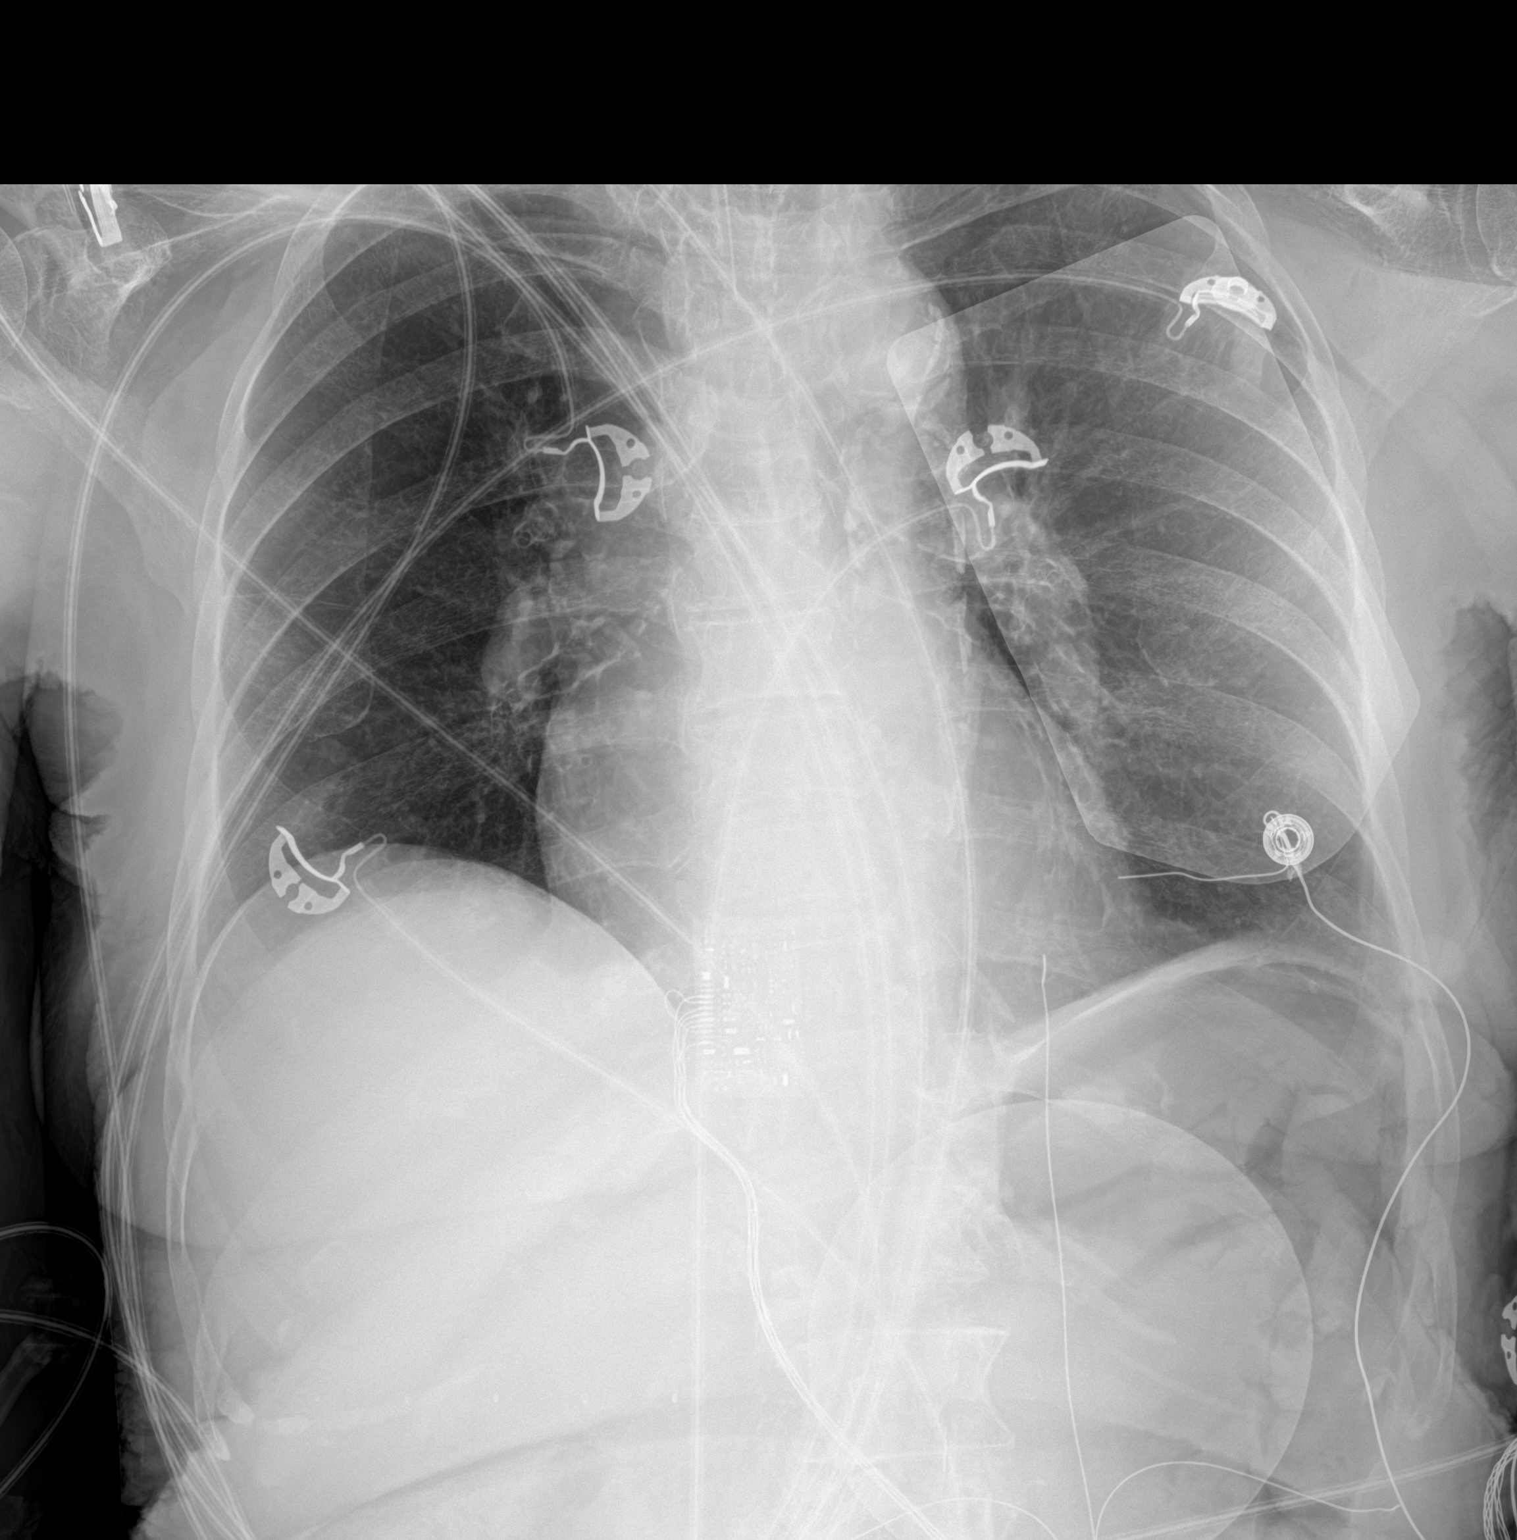

[1 of 1 positions shown; findings below may reference images not displayed]

FINDINGS: Portable AP supine view at 4045 hours. Pacer/resuscitation pads
project over the left chest. Endotracheal tube tip in good position
just below the level the clavicles. Calcified aortic
atherosclerosis. Other mediastinal contours are within normal
limits. No pneumothorax or pleural effusion identified. Allowing for
portable technique the lungs are clear. No displaced rib fracture
identified. There is mild to moderate gaseous distension of the
stomach in the left upper quadrant.
IMPRESSION: 1. Endotracheal tube tip in good position. No acute cardiopulmonary
abnormality identified.
2. Mild to moderate gaseous distension of the stomach. Consider
enteric tube placement.
3.  Aortic Atherosclerosis ([id number]).

## 2019-03-12 IMAGING — DX DG ABD PORTABLE 1V
1 series · 1 of 1 positions shown · non-contrast
Comparison: None.

CLINICAL DATA: Status post nasogastric tube placement.

EXAM:
PORTABLE ABDOMEN - 1 VIEW

[abdomen kub]
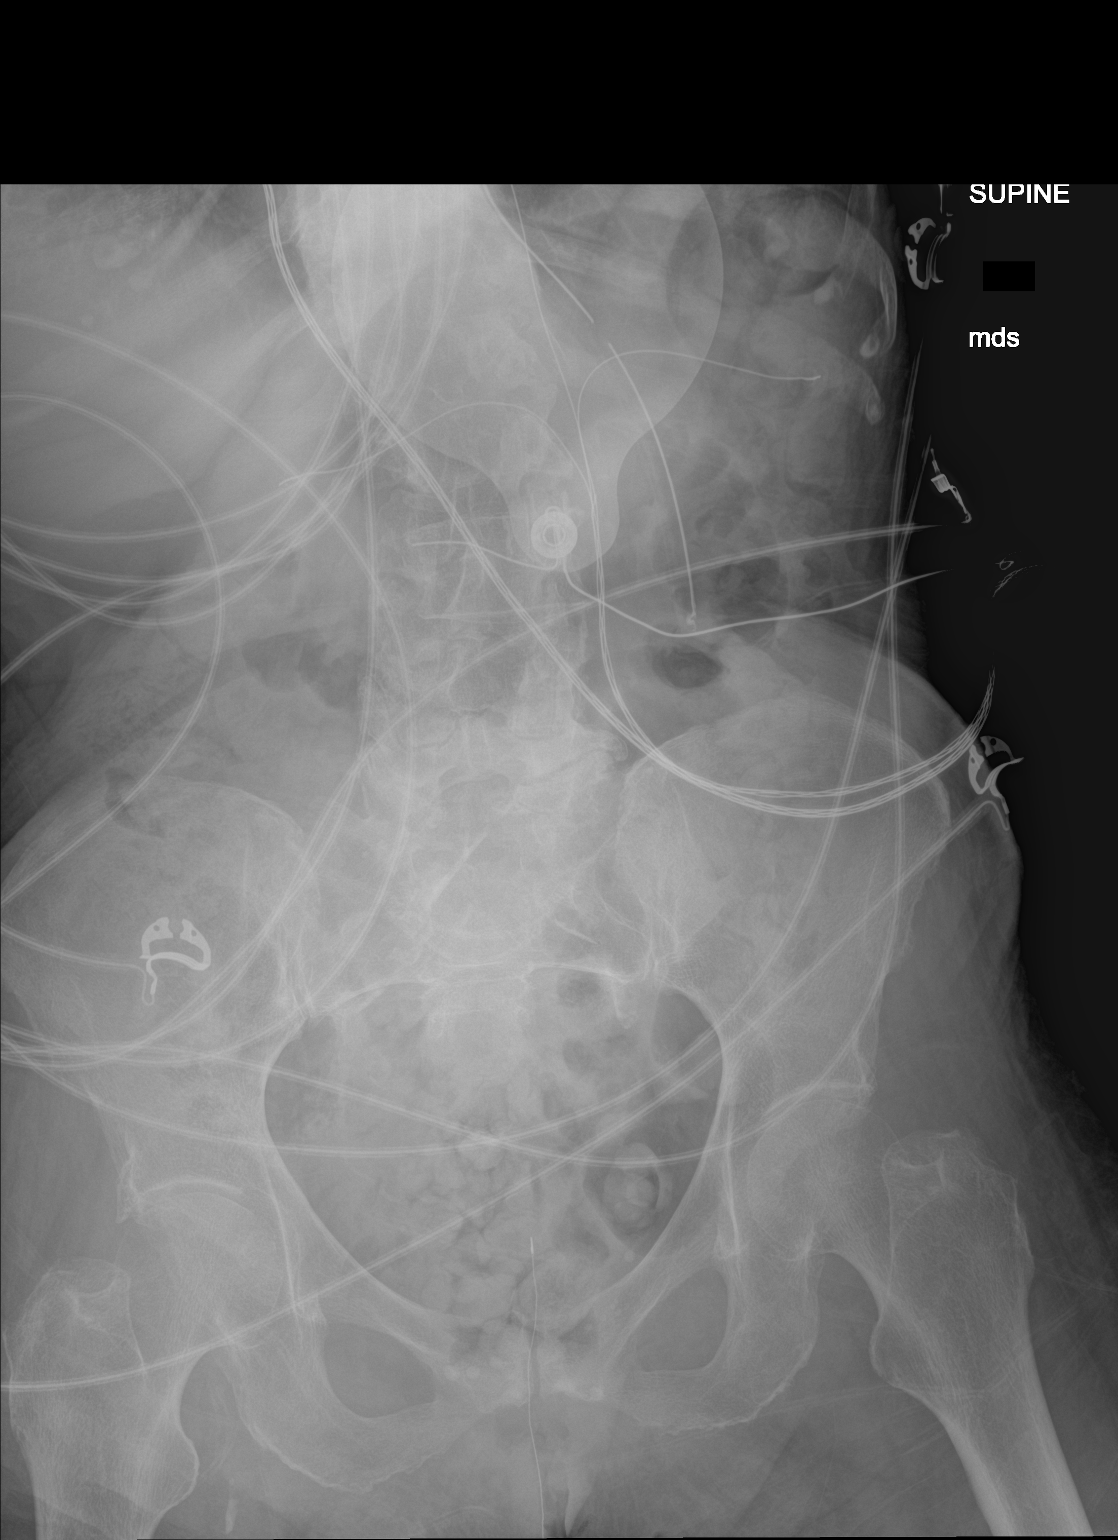

[1 of 1 positions shown; findings below may reference images not displayed]

FINDINGS: The bowel gas pattern is normal. Stool is noted in the rectum.
Distal tip of nasogastric tube is seen in expected position of the
stomach. No radio-opaque calculi or other significant radiographic
abnormality are seen.
IMPRESSION: Distal tip of nasogastric tube seen in stomach. No evidence of bowel
obstruction or ileus.
